# Patient Record
Sex: Female | Born: 1985 | Race: Black or African American | Hispanic: No | Marital: Married | Smoking: Former smoker
Health system: Southern US, Community
[De-identification: ages and names within clinical notes are randomized; demographics above are authoritative.]

## PROBLEM LIST (undated history)

## (undated) ENCOUNTER — Inpatient Hospital Stay (HOSPITAL_COMMUNITY): Payer: Self-pay

## (undated) DIAGNOSIS — Z789 Other specified health status: Secondary | ICD-10-CM

## (undated) HISTORY — PX: TONGUE SURGERY: SHX810

---

## 2011-07-13 NOTE — L&D Delivery Note (Signed)
Delivery Note At 7:57 AM a viable female was delivered via  (Presentation:  ROA ;  ).  APGAR: 9 - 9 , ; weight .   Placenta status:  Intact , .  Cord: 3 vessel with the following complications: none.  Cord pH: none  Anesthesia: Epidural  Episiotomy: none Lacerations: none Suture Repair: none Est. Blood Loss (mL):  Mom to postpartum.  Baby to nursery-stable.  [name],[name] 07/02/2012, 8:12 AM

## 2012-06-24 ENCOUNTER — Encounter: Payer: Self-pay | Admitting: *Deleted

## 2012-07-02 ENCOUNTER — Encounter (HOSPITAL_COMMUNITY): Payer: Self-pay | Admitting: *Deleted

## 2012-07-02 ENCOUNTER — Inpatient Hospital Stay (HOSPITAL_COMMUNITY): Payer: Medicaid Other | Admitting: Anesthesiology

## 2012-07-02 ENCOUNTER — Inpatient Hospital Stay (HOSPITAL_COMMUNITY)
Admission: AD | Admit: 2012-07-02 | Discharge: 2012-07-03 | DRG: 775 | Disposition: A | Discharge status: Home / Self Care | Payer: Medicaid Other | Source: Ambulatory Visit | Attending: Obstetrics | Admitting: Obstetrics

## 2012-07-02 ENCOUNTER — Encounter (HOSPITAL_COMMUNITY): Payer: Self-pay | Admitting: Anesthesiology

## 2012-07-02 LAB — CBC
MCV: 90.7 fL (ref 78.0–100.0)
Platelets: 241 10*3/uL (ref 150–400)
RBC: 3.78 MIL/uL — ABNORMAL LOW (ref 3.87–5.11)
RDW: 15.6 % — ABNORMAL HIGH (ref 11.5–15.5)
WBC: 12 10*3/uL — ABNORMAL HIGH (ref 4.0–10.5)

## 2012-07-02 LAB — OB RESULTS CONSOLE HIV ANTIBODY (ROUTINE TESTING): HIV: NONREACTIVE

## 2012-07-02 LAB — OB RESULTS CONSOLE ABO/RH

## 2012-07-02 LAB — OB RESULTS CONSOLE ANTIBODY SCREEN: Antibody Screen: NEGATIVE

## 2012-07-02 LAB — OB RESULTS CONSOLE RUBELLA ANTIBODY, IGM: Rubella: IMMUNE

## 2012-07-02 LAB — OB RESULTS CONSOLE HEPATITIS B SURFACE ANTIGEN: Hepatitis B Surface Ag: NEGATIVE

## 2012-07-02 MED ORDER — DIPHENHYDRAMINE HCL 50 MG/ML IJ SOLN: 12.5000 mg | INTRAMUSCULAR | Status: DC | PRN

## 2012-07-02 MED ORDER — ZOLPIDEM TARTRATE 5 MG PO TABS: 5.0000 mg | ORAL_TABLET | Freq: Every evening | ORAL | Status: DC | PRN

## 2012-07-02 MED ORDER — FENTANYL 2.5 MCG/ML BUPIVACAINE 1/10 % EPIDURAL INFUSION (WH - ANES)
14.0000 mL/h | INTRAMUSCULAR | Status: DC
  Administered 2012-07-02: 14 mL/h via EPIDURAL
  Filled 2012-07-02 (×2): qty 125

## 2012-07-02 MED ORDER — OXYCODONE-ACETAMINOPHEN 5-325 MG PO TABS: 1.0000 | ORAL_TABLET | ORAL | Status: DC | PRN

## 2012-07-02 MED ORDER — FLEET ENEMA 7-19 GM/118ML RE ENEM: 1.0000 | ENEMA | Freq: Once | RECTAL | Status: DC

## 2012-07-02 MED ORDER — LACTATED RINGERS IV SOLN: 500.0000 mL | INTRAVENOUS | Status: DC | PRN

## 2012-07-02 MED ORDER — CITRIC ACID-SODIUM CITRATE 334-500 MG/5ML PO SOLN: 30.0000 mL | ORAL | Status: DC | PRN

## 2012-07-02 MED ORDER — WITCH HAZEL-GLYCERIN EX PADS: 1.0000 "application " | MEDICATED_PAD | CUTANEOUS | Status: DC | PRN

## 2012-07-02 MED ORDER — OXYTOCIN BOLUS FROM INFUSION: 500.0000 mL | INTRAVENOUS | Status: DC

## 2012-07-02 MED ORDER — PRENATAL MULTIVITAMIN CH
1.0000 | ORAL_TABLET | Freq: Every day | ORAL | Status: DC
  Administered 2012-07-02 – 2012-07-03 (×2): 1 via ORAL
  Filled 2012-07-02 (×2): qty 1

## 2012-07-02 MED ORDER — EPHEDRINE 5 MG/ML INJ
10.0000 mg | INTRAVENOUS | Status: DC | PRN
  Filled 2012-07-02: qty 2

## 2012-07-02 MED ORDER — ACETAMINOPHEN 325 MG PO TABS: 650.0000 mg | ORAL_TABLET | ORAL | Status: DC | PRN

## 2012-07-02 MED ORDER — LANOLIN HYDROUS EX OINT: TOPICAL_OINTMENT | CUTANEOUS | Status: DC | PRN

## 2012-07-02 MED ORDER — LACTATED RINGERS IV SOLN: INTRAVENOUS | Status: DC

## 2012-07-02 MED ORDER — ONDANSETRON HCL 4 MG PO TABS: 4.0000 mg | ORAL_TABLET | ORAL | Status: DC | PRN

## 2012-07-02 MED ORDER — EPHEDRINE 5 MG/ML INJ
10.0000 mg | INTRAVENOUS | Status: DC | PRN
  Filled 2012-07-02: qty 4
  Filled 2012-07-02: qty 2
  Filled 2012-07-02: qty 4

## 2012-07-02 MED ORDER — BENZOCAINE-MENTHOL 20-0.5 % EX AERO: 1.0000 "application " | INHALATION_SPRAY | CUTANEOUS | Status: DC | PRN

## 2012-07-02 MED ORDER — OXYTOCIN 40 UNITS IN LACTATED RINGERS INFUSION - SIMPLE MED
62.5000 mL/h | INTRAVENOUS | Status: DC
  Administered 2012-07-02: 500 mL/h via INTRAVENOUS
  Filled 2012-07-02: qty 1000

## 2012-07-02 MED ORDER — DIPHENHYDRAMINE HCL 25 MG PO CAPS: 25.0000 mg | ORAL_CAPSULE | Freq: Four times a day (QID) | ORAL | Status: DC | PRN

## 2012-07-02 MED ORDER — LIDOCAINE HCL (PF) 1 % IJ SOLN
30.0000 mL | INTRAMUSCULAR | Status: DC | PRN
  Filled 2012-07-02: qty 30

## 2012-07-02 MED ORDER — SIMETHICONE 80 MG PO CHEW: 80.0000 mg | CHEWABLE_TABLET | ORAL | Status: DC | PRN

## 2012-07-02 MED ORDER — TETANUS-DIPHTH-ACELL PERTUSSIS 5-2.5-18.5 LF-MCG/0.5 IM SUSP: 0.5000 mL | Freq: Once | INTRAMUSCULAR | Status: DC

## 2012-07-02 MED ORDER — IBUPROFEN 600 MG PO TABS
600.0000 mg | ORAL_TABLET | Freq: Four times a day (QID) | ORAL | Status: DC
  Administered 2012-07-02 – 2012-07-03 (×4): 600 mg via ORAL
  Filled 2012-07-02 (×4): qty 1

## 2012-07-02 MED ORDER — OXYTOCIN 40 UNITS IN LACTATED RINGERS INFUSION - SIMPLE MED: 62.5000 mL/h | INTRAVENOUS | Status: DC | PRN

## 2012-07-02 MED ORDER — SENNOSIDES-DOCUSATE SODIUM 8.6-50 MG PO TABS: 2.0000 | ORAL_TABLET | Freq: Every day | ORAL | Status: DC

## 2012-07-02 MED ORDER — ACETAMINOPHEN 325 MG PO TABS
650.0000 mg | ORAL_TABLET | ORAL | Status: DC | PRN
  Administered 2012-07-02: 650 mg via ORAL
  Filled 2012-07-02: qty 2

## 2012-07-02 MED ORDER — DIBUCAINE 1 % RE OINT: 1.0000 "application " | TOPICAL_OINTMENT | RECTAL | Status: DC | PRN

## 2012-07-02 MED ORDER — ONDANSETRON HCL 4 MG/2ML IJ SOLN: 4.0000 mg | Freq: Four times a day (QID) | INTRAMUSCULAR | Status: DC | PRN

## 2012-07-02 MED ORDER — LACTATED RINGERS IV SOLN
500.0000 mL | Freq: Once | INTRAVENOUS | Status: AC
  Administered 2012-07-02: 500 mL via INTRAVENOUS

## 2012-07-02 MED ORDER — PANTOPRAZOLE SODIUM 40 MG PO TBEC
40.0000 mg | DELAYED_RELEASE_TABLET | Freq: Two times a day (BID) | ORAL | Status: DC
  Administered 2012-07-02 – 2012-07-03 (×2): 40 mg via ORAL
  Filled 2012-07-02 (×2): qty 1

## 2012-07-02 MED ORDER — IBUPROFEN 600 MG PO TABS: 600.0000 mg | ORAL_TABLET | Freq: Four times a day (QID) | ORAL | Status: DC | PRN

## 2012-07-02 MED ORDER — PHENYLEPHRINE 40 MCG/ML (10ML) SYRINGE FOR IV PUSH (FOR BLOOD PRESSURE SUPPORT)
80.0000 ug | PREFILLED_SYRINGE | INTRAVENOUS | Status: DC | PRN
  Filled 2012-07-02 (×2): qty 5
  Filled 2012-07-02: qty 2

## 2012-07-02 MED ORDER — PHENYLEPHRINE 40 MCG/ML (10ML) SYRINGE FOR IV PUSH (FOR BLOOD PRESSURE SUPPORT)
80.0000 ug | PREFILLED_SYRINGE | INTRAVENOUS | Status: DC | PRN
  Filled 2012-07-02: qty 2

## 2012-07-02 MED ORDER — ONDANSETRON HCL 4 MG/2ML IJ SOLN: 4.0000 mg | INTRAMUSCULAR | Status: DC | PRN

## 2012-07-02 MED ORDER — LIDOCAINE HCL (PF) 1 % IJ SOLN
INTRAMUSCULAR | Status: DC | PRN
  Administered 2012-07-02 (×4): 4 mL

## 2012-07-02 NOTE — Anesthesia Preprocedure Evaluation (Signed)

## 2012-07-02 NOTE — Progress Notes (Signed)
RN to the bedside to assist with pericare.  Pt. To BR for void, med. Amount of red tinge urine, pericare given, clean pad with ice pack put in place.  Pt. Tolerated void. Steady used; pt. Transferred to postpartum room #108 via WC.

## 2012-07-02 NOTE — Progress Notes (Signed)
Korayma Little is a 26 y.o. G3P1011 at [redacted]w[redacted]d by LMP admitted for active labor  Subjective:   Objective: BP 122/72  Pulse 91  Temp 97.6 F (36.4 C) (Oral)  Resp 18  Ht 5\' 4"  (1.626 m)  Wt 160 lb (72.576 kg)  BMI 27.46 kg/m2  LMP 10/01/2011      FHT:  FHR: 150 bpm, variability: moderate,  accelerations:  Present,  decelerations:  Absent UC:   regular, every 3 minutes SVE:   Dilation: 6 Effacement (%): 90 Station: -1 Exam by:: a. harper, rnc-ob  Labs: Lab Results  Component Value Date   WBC 12.0* 07/02/2012   HGB 11.0* 07/02/2012   HCT 34.3* 07/02/2012   MCV 90.7 07/02/2012   PLT 241 07/02/2012    Assessment / Plan: Spontaneous labor, progressing normally  Labor: Progressing normally Preeclampsia:  n/a Fetal Wellbeing:  Category I Pain Control:  Epidural I/D:  n/a Anticipated MOD:  NSVD  [name],[name] 07/02/2012, 5:07 AM

## 2012-07-02 NOTE — Anesthesia Postprocedure Evaluation (Signed)
  Anesthesia Post-op Note  Patient: Emma Mcdonald  Procedure(s) Performed: * No procedures listed *  Patient Location: Mother/Baby  Anesthesia Type:Epidural  Level of Consciousness: awake, alert  and oriented  Airway and Oxygen Therapy: Patient Spontanous Breathing  Post-op Pain: mild  Post-op Assessment: Patient's Cardiovascular Status Stable, Respiratory Function Stable, Patent Airway, No signs of Nausea or vomiting and Pain level controlled  Post-op Vital Signs: stable  Complications: No apparent anesthesia complications

## 2012-07-02 NOTE — Anesthesia Procedure Notes (Addendum)
Epidural Patient location during procedure: OB Start time: 07/02/2012 5:37 AM  Staffing Performed by: anesthesiologist   Preanesthetic Checklist Completed: patient identified, site marked, surgical consent, pre-op evaluation, timeout performed, IV checked, risks and benefits discussed and monitors and equipment checked  Epidural Patient position: sitting Prep: site prepped and draped and DuraPrep Patient monitoring: continuous pulse ox and blood pressure Approach: midline Injection technique: LOR air  Needle:  Needle type: Tuohy  Needle gauge: 17 G Needle length: 9 cm and 9 Needle insertion depth: 4.5 cm Catheter type: closed end flexible Catheter size: 19 Gauge Catheter at skin depth: 9.5 cm Test dose: negative  Assessment Events: blood not aspirated, injection not painful, no injection resistance, negative IV test and no paresthesia  Additional Notes Discussed risk of headache, infection, bleeding, nerve injury and failed or incomplete block.  Patient voices understanding and wishes to proceed.  Epidural placed easily on first attempt.  Patient tolerated procedure well with no apparent complications.  Jasmine December, MDReason for block:procedure for pain

## 2012-07-02 NOTE — Progress Notes (Signed)
Kacie Little is a 26 y.o. G3P1011 at [redacted]w[redacted]d by LMP admitted for active labor  Subjective:   Objective: BP 114/63  Pulse 88  Temp 97.6 F (36.4 C) (Oral)  Resp 18  Ht 5\' 4"  (1.626 m)  Wt 160 lb (72.576 kg)  BMI 27.46 kg/m2  SpO2 99%  LMP 10/01/2011      FHT:  FHR: 150 bpm, variability: moderate,  accelerations:  Present,  decelerations:  Absent UC:   regular, every 3 minutes SVE:   8cm /100% /-1/ Vertex  Labs: Lab Results  Component Value Date   WBC 12.0* 07/02/2012   HGB 11.0* 07/02/2012   HCT 34.3* 07/02/2012   MCV 90.7 07/02/2012   PLT 241 07/02/2012    Assessment / Plan: Spontaneous labor, progressing normally  Labor: Progressing normally Preeclampsia:  n/a Fetal Wellbeing:  Category I Pain Control:  Epidural I/D:  n/a Anticipated MOD:  NSVD  Sala Tague A 07/02/2012, 6:30 AM

## 2012-07-02 NOTE — H&P (Signed)
Emma Mcdonald is a 26 y.o. female presenting for UC's. Maternal Medical History:  Reason for admission: Reason for admission: contractions.  26 yo G3 P1.   EDC 07-04-12.  Presents with UC's.  Contractions: Onset was 3-5 hours ago.   Frequency: regular.   Perceived severity is moderate.    Fetal activity: Perceived fetal activity is normal.   Last perceived fetal movement was within the past hour.    Prenatal complications: no prenatal complications Prenatal Complications - Diabetes: none.    OB History    Grav Para Term Preterm Abortions TAB SAB Ect Mult Living   3 1 1  1 1    1      History reviewed. No pertinent past medical history. History reviewed. No pertinent past surgical history. Family History: family history is negative for Other. Social History:  reports that she has never smoked. She has never used smokeless tobacco. She reports that she does not drink alcohol or use illicit drugs.   Prenatal Transfer Tool  Maternal Diabetes: No Genetic Screening: Normal Maternal Ultrasounds/Referrals: Normal Fetal Ultrasounds or other Referrals:  None Maternal Substance Abuse:  No Significant Maternal Medications:  Meds include: Other:   PNV Significant Maternal Lab Results:  Lab values include: Group B Strep negative Other Comments:  None  Review of Systems  All other systems reviewed and are negative.    Dilation: 6 Effacement (%): 90 Station: -1 Exam by:: a. harper, rnc-ob Blood pressure 122/72, pulse 91, temperature 97.6 F (36.4 C), temperature source Oral, resp. rate 18, height 5\' 4"  (1.626 m), weight 160 lb (72.576 kg), last menstrual period 10/01/2011. Maternal Exam:  Uterine Assessment: Contraction strength is firm.  Abdomen: Patient reports no abdominal tenderness. Fetal presentation: vertex  Introitus: Normal vulva. Normal vagina.  Pelvis: adequate for delivery.   Cervix: Cervix evaluated by digital exam.     Physical Exam  Nursing note and vitals  reviewed. Constitutional: She is oriented to person, place, and time. She appears well-developed and well-nourished.  HENT:  Head: Normocephalic and atraumatic.  Eyes: Conjunctivae normal are normal. Pupils are equal, round, and reactive to light.  Neck: Normal range of motion. Neck supple.  Cardiovascular: Normal rate and regular rhythm.   Respiratory: Effort normal and breath sounds normal.  GI: Soft.  Genitourinary: Vagina normal and uterus normal.  Musculoskeletal: Normal range of motion.  Neurological: She is alert and oriented to person, place, and time.  Skin: Skin is warm and dry.  Psychiatric: She has a normal mood and affect. Her behavior is normal. Judgment and thought content normal.    Prenatal labs: ABO, Rh: O/Positive/-- (12/22 0000) Antibody: Negative (12/22 0000) Rubella: Immune (12/22 0000) RPR: Nonreactive (12/22 0000)  HBsAg: Negative (12/22 0000)  HIV: Non-reactive (12/22 0000)  GBS: Negative (12/22 0000)   Assessment/Plan: 39.2 weeks.  Active labor.  Expectant management.   [name],[name] 07/02/2012, 4:56 AM

## 2012-07-02 NOTE — Progress Notes (Signed)
NSVD at 69, female, Dr. Clearance Coots at the bedside.

## 2012-07-02 NOTE — MAU Note (Signed)
Contra tions since 0200 not q 2-3 minutes

## 2012-07-03 LAB — CBC
HCT: 32.5 % — ABNORMAL LOW (ref 36.0–46.0)
Hemoglobin: 10.5 g/dL — ABNORMAL LOW (ref 12.0–15.0)
MCV: 91.8 fL (ref 78.0–100.0)
WBC: 15.3 10*3/uL — ABNORMAL HIGH (ref 4.0–10.5)

## 2012-07-03 MED ORDER — MEDROXYPROGESTERONE ACETATE 150 MG/ML IM SUSP: 150.0000 mg | INTRAMUSCULAR | Status: DC

## 2012-07-03 NOTE — Discharge Summary (Signed)
  Obstetric Discharge Summary Reason for Admission: onset of labor Prenatal Procedures: none Intrapartum Procedures: spontaneous vaginal delivery Postpartum Procedures: none Complications-Operative and Postpartum: none  Hemoglobin  Date Value Range Status  07/03/2012 10.5* 12.0 - 15.0 g/dL Final     HCT  Date Value Range Status  07/03/2012 32.5* 36.0 - 46.0 % Final    Physical Exam:  General: alert Lochia: appropriate Uterine: firm Incision: n/Mcdonald DVT Evaluation: No evidence of DVT seen on physical exam.  Discharge Diagnoses: Active Problems:  Normal delivery   Discharge Information: Date: 07/03/2012 Activity: pelvic rest Diet: routine Medications:  Prior to Admission medications   Medication Sig Start Date End Date Taking? Authorizing Provider  Prenatal Vit-Fe Fumarate-FA (PRENATAL MULTIVITAMIN) TABS Take 1 tablet by mouth daily.   Yes Historical Provider, MD  medroxyPROGESTERone (DEPO-PROVERA) 150 MG/ML injection Inject 1 mL (150 mg total) into the muscle every 3 (three) months. 07/03/12   [name], MD    Condition: stable Instructions: refer to routine discharge instructions Discharge to: home Follow-up Information    Follow up with [name],[name], MD. Schedule an appointment as soon as possible for Mcdonald visit in 6 weeks.   Contact information:   [address] [place] [address] [phone]          Newborn Data: Live born  Information for the patient's newborn:  [name], [name] [[id number]]  female ; APGAR (1 MIN): 9   APGAR (5 MINS): 9     Home with mother.  Emma Mcdonald,Emma Mcdonald Emma Mcdonald 07/03/2012, 10:46 AM

## 2012-07-03 NOTE — Progress Notes (Signed)
Ur chart review completed.  

## 2012-11-27 ENCOUNTER — Ambulatory Visit (INDEPENDENT_AMBULATORY_CARE_PROVIDER_SITE_OTHER): Payer: BC Managed Care – PPO | Admitting: *Deleted

## 2012-11-27 VITALS — BP 111/73 | HR 74 | Temp 98.4°F | Wt 137.2 lb

## 2012-11-27 DIAGNOSIS — Z3202 Encounter for pregnancy test, result negative: Secondary | ICD-10-CM

## 2012-11-27 NOTE — Progress Notes (Signed)
Patient is here today for her 2 week start up for her Depo injection.  Patient was to return to office May 11.  First UPT negative.  Patient advised to return to office in two weeks and no intercourse until she comes back.

## 2012-12-11 ENCOUNTER — Other Ambulatory Visit: Payer: BC Managed Care – PPO | Admitting: *Deleted

## 2012-12-11 NOTE — Progress Notes (Signed)
Pt reports last sexually active May 22 which was after initial UPT test done in office for Depo start. Pt reports does not have regular cycles. I spoke with doctor Clearance Coots and he advised to have pt abstain and return 3 to 4 days to repeat UPT with abstinence and will start Depo at that time. Pt expressed understanding.

## 2012-12-14 ENCOUNTER — Ambulatory Visit (INDEPENDENT_AMBULATORY_CARE_PROVIDER_SITE_OTHER): Payer: BC Managed Care – PPO | Admitting: *Deleted

## 2012-12-14 VITALS — BP 107/72 | HR 75 | Temp 97.3°F | Wt 136.0 lb

## 2012-12-14 DIAGNOSIS — Z3049 Encounter for surveillance of other contraceptives: Secondary | ICD-10-CM

## 2012-12-14 DIAGNOSIS — Z3202 Encounter for pregnancy test, result negative: Secondary | ICD-10-CM

## 2012-12-14 MED ORDER — MEDROXYPROGESTERONE ACETATE 150 MG/ML IM SUSP
150.0000 mg | INTRAMUSCULAR | Status: DC
  Administered 2012-12-14 – 2013-03-13 (×2): 150 mg via INTRAMUSCULAR

## 2012-12-14 NOTE — Progress Notes (Signed)
Pt here for 2nd UPT. Pt confirms abstinence x 2 weeks. Negative UPT, depo injection given. Pt tolerated well. RTO 03/07/2013 pt expressed understanding.

## 2012-12-14 NOTE — Patient Instructions (Addendum)
Please return to office as schedule and as needed. Please call pharmacy for refills and pick-up prescription before next appointment.  

## 2013-03-07 ENCOUNTER — Ambulatory Visit: Payer: BC Managed Care – PPO

## 2013-03-13 ENCOUNTER — Ambulatory Visit (INDEPENDENT_AMBULATORY_CARE_PROVIDER_SITE_OTHER): Payer: BC Managed Care – PPO | Admitting: *Deleted

## 2013-03-13 VITALS — BP 103/68 | HR 91 | Temp 98.3°F | Wt 135.0 lb

## 2013-03-13 DIAGNOSIS — IMO0001 Reserved for inherently not codable concepts without codable children: Secondary | ICD-10-CM

## 2013-03-13 DIAGNOSIS — Z3049 Encounter for surveillance of other contraceptives: Secondary | ICD-10-CM

## 2013-03-13 NOTE — Progress Notes (Signed)
Patient is here today for her Depo injection.  Patient tolerated well. RTO for next injection 06/04/13.

## 2013-06-04 ENCOUNTER — Ambulatory Visit (INDEPENDENT_AMBULATORY_CARE_PROVIDER_SITE_OTHER): Payer: BC Managed Care – PPO | Admitting: *Deleted

## 2013-06-04 VITALS — BP 102/71 | HR 90 | Temp 98.8°F | Ht 64.0 in | Wt 136.0 lb

## 2013-06-04 DIAGNOSIS — Z309 Encounter for contraceptive management, unspecified: Secondary | ICD-10-CM

## 2013-06-04 NOTE — Progress Notes (Signed)
Subjective:     Emma Mcdonald is a 27 y.o. female here for a routine exam.  Current complaints: Patient is in the office today for her depo Provera. P{atient states she has bothersome spotting- dark discharge..  Personal health questionnaire reviewed: not asked.   Gynecologic History Patient's last menstrual period was 05/10/2013. Contraception: Depo-Provera injections Last Pap: Due with next injection  Obstetric History OB History  Gravida Para Term Preterm AB SAB TAB Ectopic Multiple Living  3 2 2  1  1   2     # Outcome Date GA Lbr Len/2nd Weight Sex Delivery Anes PTL Lv  3 TRM 07/02/12 [redacted]w[redacted]d 05:37 / 00:20 5 lb 12.4 oz (2.62 kg) F SVD EPI  Y  2 TAB 03/12/10          1 TRM 02/13/09 [redacted]w[redacted]d  5 lb 4 oz (2.381 kg) F SVD EPI N Y       The following portions of the patient's history were reviewed and updated as appropriate: allergies, current medications, past family history, past medical history, past social history, past surgical history and problem list.  Review of Systems not done    Objective:    No exam performed today, injection only.    Assessment:    Healthy female exam.    Plan:    Contraception: Depo-Provera injections.

## 2013-08-27 ENCOUNTER — Ambulatory Visit: Payer: BC Managed Care – PPO

## 2013-08-27 ENCOUNTER — Ambulatory Visit: Payer: BC Managed Care – PPO | Admitting: Obstetrics

## 2013-09-17 ENCOUNTER — Ambulatory Visit: Payer: BC Managed Care – PPO | Admitting: Obstetrics

## 2015-02-27 ENCOUNTER — Other Ambulatory Visit (INDEPENDENT_AMBULATORY_CARE_PROVIDER_SITE_OTHER): Payer: Medicaid Other

## 2015-02-27 ENCOUNTER — Encounter: Payer: Self-pay | Admitting: Obstetrics

## 2015-02-27 VITALS — BP 103/72 | HR 85 | Temp 98.4°F | Ht 64.0 in | Wt 143.3 lb

## 2015-02-27 DIAGNOSIS — Z3200 Encounter for pregnancy test, result unknown: Secondary | ICD-10-CM

## 2015-02-27 LAB — POCT URINE PREGNANCY: Preg Test, Ur: POSITIVE — AB

## 2015-04-10 ENCOUNTER — Encounter: Payer: Self-pay | Admitting: Obstetrics

## 2015-04-10 ENCOUNTER — Ambulatory Visit (INDEPENDENT_AMBULATORY_CARE_PROVIDER_SITE_OTHER): Payer: Medicaid Other | Admitting: Obstetrics

## 2015-04-10 VITALS — BP 97/69 | HR 91 | Wt 143.0 lb

## 2015-04-10 DIAGNOSIS — Z3481 Encounter for supervision of other normal pregnancy, first trimester: Secondary | ICD-10-CM | POA: Diagnosis not present

## 2015-04-10 DIAGNOSIS — Z1389 Encounter for screening for other disorder: Secondary | ICD-10-CM

## 2015-04-10 DIAGNOSIS — Z331 Pregnant state, incidental: Secondary | ICD-10-CM

## 2015-04-10 DIAGNOSIS — Z3687 Encounter for antenatal screening for uncertain dates: Secondary | ICD-10-CM

## 2015-04-10 LAB — POCT URINALYSIS DIPSTICK
BILIRUBIN UA: NEGATIVE
GLUCOSE UA: NEGATIVE
Ketones, UA: NEGATIVE
Nitrite, UA: NEGATIVE
SPEC GRAV UA: 1.02
UROBILINOGEN UA: NEGATIVE
pH, UA: 5

## 2015-04-10 MED ORDER — COMPLETE NATAL DHA 29-1-200 & 250 MG PO MISC: 1.0000 | Freq: Every day | ORAL | Status: DC

## 2015-04-10 NOTE — Progress Notes (Signed)
Subjective:    Emma Mcdonald is being seen today for her first obstetrical visit.  This is a planned pregnancy. She is at [redacted]w[redacted]d gestation. Her obstetrical history is significant for none. Relationship with FOB: spouse, living together. Patient does intend to breast feed. Pregnancy history fully reviewed.  The information documented in the HPI was reviewed and verified.  Menstrual History: OB History    Gravida Para Term Preterm AB TAB SAB Ectopic Multiple Living   Patient's last menstrual period was 01/27/2015.    History reviewed. No pertinent past medical history.  History reviewed. No pertinent past surgical history.   (Not in a hospital admission) No Known Allergies  Social History  Substance Use Topics  . Smoking status: Never Smoker   . Smokeless tobacco: Never Used  . Alcohol Use: 0.0 oz/week    0 Standard drinks or equivalent per week    Family History  Problem Relation Age of Onset  . Other Neg Hx   . Cancer Mother      Review of Systems Constitutional: negative for weight loss Gastrointestinal: negative for vomiting Genitourinary:negative for genital lesions and vaginal discharge and dysuria Musculoskeletal:negative for back pain Behavioral/Psych: negative for abusive relationship, depression, illegal drug usage and tobacco use    Objective:    BP 97/69 mmHg  Pulse 91  Wt 143 lb (64.864 kg)  LMP 01/27/2015 General Appearance:    Alert, cooperative, no distress, appears stated age  Head:    Normocephalic, without obvious abnormality, atraumatic  Eyes:    PERRL, conjunctiva/corneas clear, EOM's intact, fundi    benign, both eyes  Ears:    Normal TM's and external ear canals, both ears  Nose:   Nares normal, septum midline, mucosa normal, no drainage    or sinus tenderness  Throat:   Lips, mucosa, and tongue normal; teeth and gums normal  Neck:   Supple, symmetrical, trachea midline, no adenopathy;    thyroid:  no  enlargement/tenderness/nodules; no carotid   bruit or JVD  Back:     Symmetric, no curvature, ROM normal, no CVA tenderness  Lungs:     Clear to auscultation bilaterally, respirations unlabored  Chest Wall:    No tenderness or deformity   Heart:    Regular rate and rhythm, S1 and S2 normal, no murmur, rub   or gallop  Breast Exam:    No tenderness, masses, or nipple abnormality  Abdomen:     Soft, non-tender, bowel sounds active all four quadrants,    no masses, no organomegaly  Genitalia:    Normal female without lesion, discharge or tenderness  Extremities:   Extremities normal, atraumatic, no cyanosis or edema  Pulses:   2+ and symmetric all extremities  Skin:   Skin color, texture, turgor normal, no rashes or lesions  Lymph nodes:   Cervical, supraclavicular, and axillary nodes normal  Neurologic:   CNII-XII intact, normal strength, sensation and reflexes    throughout      Lab Review Urine pregnancy test Labs reviewed yes Radiologic studies reviewed no  Assessment:    Pregnancy at [redacted]w[redacted]d weeks    Unsure LMP   Plan:   Ultrasound ordered for dating.    Prenatal vitamins.  Counseling provided regarding continued use of seat belts, cessation of alcohol consumption, smoking or use of illicit drugs; infection precautions i.e., influenza/TDAP immunizations, toxoplasmosis,CMV, parvovirus, listeria and varicella; workplace safety, exercise during pregnancy; routine  dental care, safe medications, sexual activity, hot tubs, saunas, pools, travel, caffeine use, fish and methlymercury, potential toxins, hair treatments, varicose veins Weight gain recommendations per IOM guidelines reviewed: underweight/BMI< 18.5--> gain 28 - 40 lbs; normal weight/BMI 18.5 - 24.9--> gain 25 - 35 lbs; overweight/BMI 25 - 29.9--> gain 15 - 25 lbs; obese/BMI >30->gain  11 - 20 lbs Problem list reviewed and updated. FIRST/CF mutation testing/NIPT/QUAD SCREEN/fragile X/Ashkenazi Jewish population testing/Spinal  muscular atrophy discussed: requested. Role of ultrasound in pregnancy discussed; fetal survey: requested. Amniocentesis discussed: not indicated. VBAC calculator score: VBAC consent form provided Meds ordered this encounter  Medications  . Prenat-FeBis-FePro-FA-CA-Omega (COMPLETE NATAL DHA) 29-1-200 & 250 MG MISC    Sig: Take 1 tablet by mouth daily before breakfast.    Dispense:  90 each    Refill:  3   Orders Placed This Encounter  Procedures  . Culture, OB Urine  . SureSwab, Vaginosis/Vaginitis Plus  . US OB Comp Less 14 Wks    Standing Status: Future     Number of Occurrences:      Standing Expiration Date: 06/09/2016    Order Specific Question:  Reason for Exam (SYMPTOM  OR DIAGNOSIS REQUIRED)    Answer:  unsure LMP    Order Specific Question:  Preferred imaging location?    Answer:  [facility]  . Obstetric panel  . HIV antibody  . Hemoglobinopathy evaluation  . Varicella zoster antibody, IgG  . Vit D  25 hydroxy (rtn osteoporosis monitoring)  . TSH  . POCT urinalysis dipstick    Follow up in 4 weeks.

## 2015-04-11 LAB — OBSTETRIC PANEL
Antibody Screen: NEGATIVE
BASOS PCT: 1 % (ref 0–1)
Basophils Absolute: 0.1 10*3/uL (ref 0.0–0.1)
EOS ABS: 0.1 10*3/uL (ref 0.0–0.7)
Eosinophils Relative: 1 % (ref 0–5)
HEMATOCRIT: 41.6 % (ref 36.0–46.0)
HEMOGLOBIN: 14.2 g/dL (ref 12.0–15.0)
HEP B S AG: NEGATIVE
Lymphocytes Relative: 20 % (ref 12–46)
Lymphs Abs: 1.7 10*3/uL (ref 0.7–4.0)
MCH: 32.2 pg (ref 26.0–34.0)
MCHC: 34.1 g/dL (ref 30.0–36.0)
MCV: 94.3 fL (ref 78.0–100.0)
MPV: 9.9 fL (ref 8.6–12.4)
Monocytes Absolute: 0.5 10*3/uL (ref 0.1–1.0)
Monocytes Relative: 6 % (ref 3–12)
NEUTROS ABS: 6.2 10*3/uL (ref 1.7–7.7)
NEUTROS PCT: 72 % (ref 43–77)
Platelets: 306 10*3/uL (ref 150–400)
RBC: 4.41 MIL/uL (ref 3.87–5.11)
RDW: 13.3 % (ref 11.5–15.5)
Rh Type: POSITIVE
Rubella: 0.85 Index (ref <0.90)
WBC: 8.6 10*3/uL (ref 4.0–10.5)

## 2015-04-11 LAB — CULTURE, OB URINE
Colony Count: NO GROWTH
ORGANISM ID, BACTERIA: NO GROWTH

## 2015-04-11 LAB — VITAMIN D 25 HYDROXY (VIT D DEFICIENCY, FRACTURES): Vit D, 25-Hydroxy: 25 ng/mL — ABNORMAL LOW (ref 30–100)

## 2015-04-11 LAB — HIV ANTIBODY (ROUTINE TESTING W REFLEX): HIV 1&2 Ab, 4th Generation: NONREACTIVE

## 2015-04-11 LAB — VARICELLA ZOSTER ANTIBODY, IGG: VARICELLA IGG: 147.5 {index} — AB (ref <135.00)

## 2015-04-11 LAB — TSH: TSH: 1.77 u[IU]/mL (ref 0.350–4.500)

## 2015-04-14 LAB — SURESWAB, VAGINOSIS/VAGINITIS PLUS
Atopobium vaginae: NOT DETECTED Log (cells/mL)
C. ALBICANS, DNA: NOT DETECTED
C. PARAPSILOSIS, DNA: NOT DETECTED
C. glabrata, DNA: NOT DETECTED
C. trachomatis RNA, TMA: NOT DETECTED
C. tropicalis, DNA: NOT DETECTED
Gardnerella vaginalis: NOT DETECTED Log (cells/mL)
LACTOBACILLUS SPECIES: 7.9 Log (cells/mL)
MEGASPHAERA SPECIES: NOT DETECTED Log (cells/mL)
N. GONORRHOEAE RNA, TMA: NOT DETECTED
T. VAGINALIS RNA, QL TMA: NOT DETECTED

## 2015-04-14 LAB — PAP IG W/ RFLX HPV ASCU

## 2015-04-14 LAB — HEMOGLOBINOPATHY EVALUATION
HEMOGLOBIN OTHER: 0 %
HGB A2 QUANT: 2.5 % (ref 2.2–3.2)
HGB A: 97.5 % (ref 96.8–97.8)
HGB S QUANTITAION: 0 %
Hgb F Quant: 0 % (ref 0.0–2.0)

## 2015-04-16 ENCOUNTER — Ambulatory Visit (HOSPITAL_COMMUNITY)
Admission: RE | Admit: 2015-04-16 | Discharge: 2015-04-16 | Disposition: A | Discharge status: Home / Self Care | Payer: 59 | Source: Ambulatory Visit | Attending: Obstetrics | Admitting: Obstetrics

## 2015-04-16 DIAGNOSIS — Z36 Encounter for antenatal screening of mother: Secondary | ICD-10-CM | POA: Diagnosis not present

## 2015-04-16 DIAGNOSIS — Z3A11 11 weeks gestation of pregnancy: Secondary | ICD-10-CM | POA: Diagnosis not present

## 2015-04-16 DIAGNOSIS — Z3687 Encounter for antenatal screening for uncertain dates: Secondary | ICD-10-CM

## 2015-05-07 ENCOUNTER — Ambulatory Visit (INDEPENDENT_AMBULATORY_CARE_PROVIDER_SITE_OTHER): Payer: 59 | Admitting: Certified Nurse Midwife

## 2015-05-07 VITALS — BP 99/64 | HR 96 | Temp 98.8°F | Wt 144.0 lb

## 2015-05-07 DIAGNOSIS — Z331 Pregnant state, incidental: Secondary | ICD-10-CM | POA: Diagnosis not present

## 2015-05-07 DIAGNOSIS — Z3482 Encounter for supervision of other normal pregnancy, second trimester: Secondary | ICD-10-CM | POA: Diagnosis not present

## 2015-05-07 DIAGNOSIS — Z1389 Encounter for screening for other disorder: Secondary | ICD-10-CM | POA: Diagnosis not present

## 2015-05-07 LAB — POCT URINALYSIS DIPSTICK
Bilirubin, UA: NEGATIVE
Glucose, UA: NEGATIVE
Ketones, UA: NEGATIVE
Leukocytes, UA: NEGATIVE
NITRITE UA: NEGATIVE
PH UA: 6.5
Protein, UA: NEGATIVE
RBC UA: 50
SPEC GRAV UA: 1.015
UROBILINOGEN UA: NEGATIVE

## 2015-05-07 NOTE — Progress Notes (Signed)
  Subjective:    Emma Mcdonald is a 29 y.o. female being seen today for her obstetrical visit. She is at 5818w2d gestation. Patient reports: no complaints.  Is employed as an Print production planneroffice manager.  Has two little girls.    Problem List Items Addressed This Visit    None    Visit Diagnoses    Encounter for supervision of other normal pregnancy in second trimester    -  Primary    Relevant Orders    POCT urinalysis dipstick (Completed)    US OB Comp + 14 Wk    AFP, Quad Screen      Patient Active Problem List   Diagnosis Date Noted  . Normal delivery 07/03/2012    Objective:     BP 99/64 mmHg  Pulse 96  Temp(Src) 98.8 F (37.1 C)  Wt 144 lb (65.318 kg)  LMP 01/27/2015 Uterine Size: Below umbilicus   FHR: 152  Assessment:    Pregnancy @ 7618w2d  weeks Doing well    Plan:    Problem list reviewed and updated. Labs reviewed.  Follow up in 4 weeks. FIRST/CF mutation testing/NIPT/QUAD SCREEN/fragile X/Ashkenazi Jewish population testing/Spinal muscular atrophy discussed: ordered. Role of ultrasound in pregnancy discussed; fetal survey: ordered. Amniocentesis discussed: not indicated. 50% of 15 minute visit spent on counseling and coordination of care.

## 2015-05-12 LAB — AFP, QUAD SCREEN
AFP: 41.4 ng/mL
Age Alone: 1:875 {titer}
CURR GEST AGE: 14.3 wks.days
Down Syndrome Scr Risk Est: 1:7610 {titer}
HCG TOTAL: 65.34 [IU]/mL
INH: 94.2 pg/mL
Interpretation-AFP: NEGATIVE
MOM FOR HCG: 1
MOM FOR INH: 0.48
MoM for AFP: 1.29
Open Spina bifida: NEGATIVE
Osb Risk: 1:10400 {titer}
Tri 18 Scr Risk Est: NEGATIVE
Trisomy 18 (Edward) Syndrome Interp.: 1:10000 {titer}
UE3 MOM: 0.59
uE3 Value: 0.32 ng/mL

## 2015-05-29 ENCOUNTER — Telehealth: Payer: Self-pay | Admitting: *Deleted

## 2015-05-29 NOTE — Telephone Encounter (Signed)
Patient has a sinus headache and wants to know what to take. 2:30 Per Dr Clearance CootsHarper- Patient can use Tylenol with sudafed, Excedrin/sinus or he can Rx Fioricet for her. Patient prefers to use OTC for now- will watch her symptoms and call if she should get worse.

## 2015-06-12 ENCOUNTER — Ambulatory Visit (INDEPENDENT_AMBULATORY_CARE_PROVIDER_SITE_OTHER): Payer: Medicaid Other

## 2015-06-12 ENCOUNTER — Ambulatory Visit (INDEPENDENT_AMBULATORY_CARE_PROVIDER_SITE_OTHER): Payer: Medicaid Other | Admitting: Obstetrics

## 2015-06-12 ENCOUNTER — Encounter: Payer: Self-pay | Admitting: Obstetrics

## 2015-06-12 VITALS — BP 94/66 | HR 81 | Wt 146.0 lb

## 2015-06-12 DIAGNOSIS — Z3482 Encounter for supervision of other normal pregnancy, second trimester: Secondary | ICD-10-CM

## 2015-06-12 DIAGNOSIS — Z36 Encounter for antenatal screening of mother: Secondary | ICD-10-CM

## 2015-06-12 LAB — POCT URINALYSIS DIPSTICK
BILIRUBIN UA: NEGATIVE
GLUCOSE UA: NEGATIVE
KETONES UA: NEGATIVE
Leukocytes, UA: NEGATIVE
NITRITE UA: NEGATIVE
PH UA: 6
Protein, UA: NEGATIVE
RBC UA: 50
SPEC GRAV UA: 1.01
Urobilinogen, UA: NEGATIVE

## 2015-06-12 NOTE — Progress Notes (Signed)
Subjective:    Emma Mcdonald is a 29 y.o. female being seen today for her obstetrical visit. She is at 869w3d gestation. Patient reports: no complaints . Fetal movement: normal.  Problem List Items Addressed This Visit    None    Visit Diagnoses    Encounter for supervision of other normal pregnancy in second trimester    -  Primary    Relevant Orders    POCT urinalysis dipstick (Completed)      Patient Active Problem List   Diagnosis Date Noted  . Normal delivery 07/03/2012   Objective:    BP 94/66 mmHg  Pulse 81  Wt 146 lb (66.225 kg)  LMP 01/27/2015 FHT: 160 BPM  Uterine Size: size equals dates     Assessment:    Pregnancy @ 669w3d  .  Doing well.  Plan:     Labs, problem list reviewed and updated 2 hr GTT planned Follow up in 4 weeks.

## 2015-06-13 ENCOUNTER — Other Ambulatory Visit: Payer: Self-pay | Admitting: Certified Nurse Midwife

## 2015-06-13 ENCOUNTER — Other Ambulatory Visit: Payer: Self-pay | Admitting: Obstetrics

## 2015-06-13 DIAGNOSIS — O358XX1 Maternal care for other (suspected) fetal abnormality and damage, fetus 1: Principal | ICD-10-CM

## 2015-06-13 DIAGNOSIS — IMO0001 Reserved for inherently not codable concepts without codable children: Secondary | ICD-10-CM

## 2015-06-17 ENCOUNTER — Other Ambulatory Visit: Payer: 59

## 2015-06-20 ENCOUNTER — Ambulatory Visit (HOSPITAL_COMMUNITY)
Admission: RE | Admit: 2015-06-20 | Discharge: 2015-06-20 | Disposition: A | Discharge status: Home / Self Care | Payer: Medicaid Other | Source: Ambulatory Visit | Attending: Obstetrics | Admitting: Obstetrics

## 2015-06-20 ENCOUNTER — Other Ambulatory Visit: Payer: Self-pay | Admitting: Obstetrics

## 2015-06-20 VITALS — BP 104/56 | HR 88 | Wt 149.4 lb

## 2015-06-20 DIAGNOSIS — O358XX1 Maternal care for other (suspected) fetal abnormality and damage, fetus 1: Secondary | ICD-10-CM

## 2015-06-20 DIAGNOSIS — Z1389 Encounter for screening for other disorder: Secondary | ICD-10-CM

## 2015-06-20 DIAGNOSIS — O358XX0 Maternal care for other (suspected) fetal abnormality and damage, not applicable or unspecified: Secondary | ICD-10-CM | POA: Diagnosis present

## 2015-06-20 DIAGNOSIS — Z36 Encounter for antenatal screening of mother: Secondary | ICD-10-CM | POA: Diagnosis not present

## 2015-06-20 DIAGNOSIS — IMO0001 Reserved for inherently not codable concepts without codable children: Secondary | ICD-10-CM

## 2015-06-20 DIAGNOSIS — Z3A20 20 weeks gestation of pregnancy: Secondary | ICD-10-CM

## 2015-07-10 ENCOUNTER — Encounter: Payer: Self-pay | Admitting: Obstetrics

## 2015-07-10 ENCOUNTER — Ambulatory Visit (INDEPENDENT_AMBULATORY_CARE_PROVIDER_SITE_OTHER): Payer: Medicaid Other | Admitting: Obstetrics

## 2015-07-10 VITALS — BP 96/64 | HR 95 | Temp 97.6°F | Wt 157.0 lb

## 2015-07-10 DIAGNOSIS — Z3482 Encounter for supervision of other normal pregnancy, second trimester: Secondary | ICD-10-CM

## 2015-07-10 LAB — POCT URINALYSIS DIPSTICK
BILIRUBIN UA: NEGATIVE
GLUCOSE UA: NEGATIVE
Ketones, UA: NEGATIVE
Leukocytes, UA: NEGATIVE
NITRITE UA: NEGATIVE
Protein, UA: NEGATIVE
RBC UA: 50
Spec Grav, UA: 1.015
Urobilinogen, UA: NEGATIVE
pH, UA: 6

## 2015-07-10 NOTE — Progress Notes (Signed)
Subjective:    Emma Mcdonald is a 29 y.o. female being seen today for her obstetrical visit. She is at 3070w3d gestation. Patient reports: no complaints . Fetal movement: normal.  Problem List Items Addressed This Visit    None    Visit Diagnoses    Supervision of other normal pregnancy, antepartum, second trimester    -  Primary    Relevant Orders    POCT urinalysis dipstick (Completed)      Patient Active Problem List   Diagnosis Date Noted  . Normal delivery 07/03/2012   Objective:    BP 96/64 mmHg  Pulse 95  Temp(Src) 97.6 F (36.4 C)  Wt 157 lb (71.215 kg)  LMP 01/27/2015 FHT: 160 BPM  Uterine Size: size equals dates     Assessment:    Pregnancy @ 3270w3d    Plan:    OBGCT: ordered for next visit.  Labs, problem list reviewed and updated 2 hr GTT planned Follow up in 4 weeks.

## 2015-07-13 NOTE — L&D Delivery Note (Signed)
Delivery Note At 11:11 AM a viable female was delivered via  (Presentation: LOA ;  ).  APGAR: 9, 9; weight: 3105 grams  .   Placenta status: Intact, spontaneous delivery, .  Cord: 3-vessel with the following complications: None .  Cord pH: none  Anesthesia: Epidural  Episiotomy: None Lacerations:None  Suture Repair: none Est. Blood Loss (mL): 350  Mom to postpartum.  Baby to Couplet care / Skin to Skin.  Emma Mcdonald A 10/25/2015, 12:06 PM

## 2015-08-01 ENCOUNTER — Ambulatory Visit (HOSPITAL_COMMUNITY)
Admission: RE | Admit: 2015-08-01 | Discharge: 2015-08-01 | Disposition: A | Discharge status: Home / Self Care | Payer: Medicaid Other | Source: Ambulatory Visit | Attending: Obstetrics | Admitting: Obstetrics

## 2015-08-01 ENCOUNTER — Encounter (HOSPITAL_COMMUNITY): Payer: Self-pay

## 2015-08-01 DIAGNOSIS — Z3A26 26 weeks gestation of pregnancy: Secondary | ICD-10-CM | POA: Diagnosis not present

## 2015-08-01 DIAGNOSIS — O358XX0 Maternal care for other (suspected) fetal abnormality and damage, not applicable or unspecified: Secondary | ICD-10-CM | POA: Insufficient documentation

## 2015-08-01 DIAGNOSIS — IMO0001 Reserved for inherently not codable concepts without codable children: Secondary | ICD-10-CM

## 2015-08-07 ENCOUNTER — Other Ambulatory Visit: Payer: Medicaid Other

## 2015-08-07 ENCOUNTER — Encounter: Payer: Self-pay | Admitting: Obstetrics

## 2015-08-07 ENCOUNTER — Ambulatory Visit (INDEPENDENT_AMBULATORY_CARE_PROVIDER_SITE_OTHER): Payer: Medicaid Other | Admitting: Obstetrics

## 2015-08-07 VITALS — BP 105/66 | HR 95 | Temp 98.3°F | Wt 161.0 lb

## 2015-08-07 DIAGNOSIS — Z3483 Encounter for supervision of other normal pregnancy, third trimester: Secondary | ICD-10-CM

## 2015-08-07 DIAGNOSIS — K219 Gastro-esophageal reflux disease without esophagitis: Secondary | ICD-10-CM

## 2015-08-07 LAB — POCT URINALYSIS DIPSTICK
BILIRUBIN UA: NEGATIVE
Blood, UA: 50
GLUCOSE UA: NEGATIVE
Ketones, UA: NEGATIVE
Leukocytes, UA: NEGATIVE
Nitrite, UA: NEGATIVE
Protein, UA: NEGATIVE
SPEC GRAV UA: 1.01
Urobilinogen, UA: NEGATIVE
pH, UA: 6

## 2015-08-07 LAB — CBC
HEMATOCRIT: 33 % — AB (ref 36.0–46.0)
Hemoglobin: 11 g/dL — ABNORMAL LOW (ref 12.0–15.0)
MCH: 31 pg (ref 26.0–34.0)
MCHC: 33.3 g/dL (ref 30.0–36.0)
MCV: 93 fL (ref 78.0–100.0)
MPV: 9.3 fL (ref 8.6–12.4)
Platelets: 256 10*3/uL (ref 150–400)
RBC: 3.55 MIL/uL — ABNORMAL LOW (ref 3.87–5.11)
RDW: 13.4 % (ref 11.5–15.5)
WBC: 13.2 10*3/uL — AB (ref 4.0–10.5)

## 2015-08-07 MED ORDER — OMEPRAZOLE 20 MG PO CPDR: 20.0000 mg | DELAYED_RELEASE_CAPSULE | Freq: Two times a day (BID) | ORAL | Status: DC

## 2015-08-07 NOTE — Progress Notes (Signed)
Subjective:    Emma Mcdonald is a 30 y.o. female being seen today for her obstetrical visit. She is at [redacted]w[redacted]d gestation. Patient reports: heartburn . Fetal movement: normal.  Problem List Items Addressed This Visit    None    Visit Diagnoses    Supervision of other normal pregnancy, antepartum, third trimester    -  Primary    Relevant Orders    POCT urinalysis dipstick    Glucose Tolerance, 2 Hours w/1 Hour    CBC    HIV antibody    RPR    GERD without esophagitis        Relevant Medications    omeprazole (PRILOSEC) 20 MG capsule      Patient Active Problem List   Diagnosis Date Noted  . Normal delivery 07/03/2012   Objective:    BP 105/66 mmHg  Pulse 95  Temp(Src) 98.3 F (36.8 C)  Wt 161 lb (73.029 kg)  LMP 01/27/2015 FHT: 160 BPM  Uterine Size: size equals dates     Assessment:    Pregnancy @ [redacted]w[redacted]d    Plan:    OBGCT: ordered. Signs and symptoms of preterm labor: discussed.  Labs, problem list reviewed and updated 2 hr GTT planned Follow up in 2 weeks.

## 2015-08-07 NOTE — Patient Instructions (Signed)
Heartburn During Pregnancy Heartburn is a burning sensation in the chest caused by stomach acid backing up into the esophagus. Heartburn is common in pregnancy because a certain hormone (progesterone) is released when a woman is pregnant. The progesterone hormone may relax the valve that separates the esophagus from the stomach. This allows acid to go up into the esophagus, causing heartburn. Heartburn may also happen in pregnancy because the enlarging uterus pushes up on the stomach, which pushes more acid into the esophagus. This is especially true in the later stages of pregnancy. Heartburn problems usually go away after giving birth. CAUSES  Heartburn is caused by stomach acid backing up into the esophagus. During pregnancy, this may result from various things, including:   The progesterone hormone.  Changing hormone levels.  The growing uterus pushing stomach acid upward.  Large meals.  Certain foods and drinks.  Exercise.  Increased acid production. SIGNS AND SYMPTOMS   Burning pain in the chest or lower throat.  Bitter taste in the mouth.  Coughing. DIAGNOSIS  Your health care provider will typically diagnose heartburn by taking a careful history of your concern. Blood tests may be done to check for a certain type of bacteria that is associated with heartburn. Sometimes, heartburn is diagnosed by prescribing a heartburn medicine to see if the symptoms improve. In some cases, a procedure called an endoscopy may be done. In this procedure, a tube with a light and a camera on the end (endoscope) is used to examine the esophagus and the stomach. TREATMENT  Treatment will vary depending on the severity of your symptoms. Your health care provider may recommend:  Over-the-counter medicines (antacids, acid reducers) for mild heartburn.  Prescription medicines to decrease stomach acid or to protect your stomach lining.  Certain changes in your diet.  Elevating the head of your bed  by putting blocks under the legs. This helps prevent stomach acid from backing up into the esophagus when you are lying down. HOME CARE INSTRUCTIONS   Only take over-the-counter or prescription medicines as directed by your health care provider.  Raise the head of your bed by putting blocks under the legs if instructed to do so by your health care provider. Sleeping with more pillows is not effective because it only changes the position of your head.  Do not exercise right after eating.  Avoid eating 2-3 hours before bed. Do not lie down right after eating.  Eat small meals throughout the day instead of three large meals.  Identify foods and beverages that make your symptoms worse and avoid them. Foods you may want to avoid include:  Peppers.  Chocolate.  High-fat foods, including fried foods.  Spicy foods.  Garlic and onions.  Citrus fruits, including oranges, grapefruit, lemons, and limes.  Food containing tomatoes or tomato products.  Mint.  Carbonated and caffeinated drinks.  Vinegar. SEEK MEDICAL CARE IF:  You have abdominal pain of any kind.  You feel burning in your upper abdomen or chest, especially after eating or lying down.  You have nausea and vomiting.  Your stomach feels upset after you eat. SEEK IMMEDIATE MEDICAL CARE IF:   You have severe chest pain that goes down your arm or into your jaw or neck.  You feel sweaty, dizzy, or light-headed.  You become short of breath.  You vomit blood.  You have difficulty or pain with swallowing.  You have bloody or black, tarry stools.  You have episodes of heartburn more than 3 times a week,   for more than 2 weeks. MAKE SURE YOU:  Understand these instructions.  Will watch your condition.  Will get help right away if you are not doing well or get worse.   This information is not intended to replace advice given to you by your health care provider. Make sure you discuss any questions you have with  your health care provider.   Document Released: 06/25/2000 Document Revised: 07/19/2014 Document Reviewed: 02/14/2013 Elsevier Interactive Patient Education 2016 [employer]. Preterm Labor Information Preterm labor is when labor starts at less than 37 weeks of pregnancy. The normal length of a pregnancy is 39 to 41 weeks. CAUSES Often, there is no identifiable underlying cause as to why a woman goes into preterm labor. One of the most common known causes of preterm labor is infection. Infections of the uterus, cervix, vagina, amniotic sac, bladder, kidney, or even the lungs (pneumonia) can cause labor to start. Other suspected causes of preterm labor include:   Urogenital infections, such as yeast infections and bacterial vaginosis.   Uterine abnormalities (uterine shape, uterine septum, fibroids, or bleeding from the placenta).   A cervix that has been operated on (it may fail to stay closed).   Malformations in the fetus.   Multiple gestations (twins, triplets, and so on).   Breakage of the amniotic sac.  RISK FACTORS  Having a previous history of preterm labor.   Having premature rupture of membranes (PROM).   Having a placenta that covers the opening of the cervix (placenta previa).   Having a placenta that separates from the uterus (placental abruption).   Having a cervix that is too weak to hold the fetus in the uterus (incompetent cervix).   Having too much fluid in the amniotic sac (polyhydramnios).   Taking illegal drugs or smoking while pregnant.   Not gaining enough weight while pregnant.   Being younger than 70 and older than 29 years old.   Having a low socioeconomic status.   Being African American. SYMPTOMS Signs and symptoms of preterm labor include:   Menstrual-like cramps, abdominal pain, or back pain.  Uterine contractions that are regular, as frequent as six in an hour, regardless of their intensity (may be mild or  painful).  Contractions that start on the top of the uterus and spread down to the lower abdomen and back.   A sense of increased pelvic pressure.   A watery or bloody mucus discharge that comes from the vagina.  TREATMENT Depending on the length of the pregnancy and other circumstances, your health care provider may suggest bed rest. If necessary, there are medicines that can be given to stop contractions and to mature the fetal lungs. If labor happens before 34 weeks of pregnancy, a prolonged hospital stay may be recommended. Treatment depends on the condition of both you and the fetus.  WHAT SHOULD YOU DO IF YOU THINK YOU ARE IN PRETERM LABOR? Call your health care provider right away. You will need to go to the hospital to get checked immediately. HOW CAN YOU PREVENT PRETERM LABOR IN FUTURE PREGNANCIES? You should:   Stop smoking if you smoke.  Maintain healthy weight gain and avoid chemicals and drugs that are not necessary.  Be watchful for any type of infection.  Inform your health care provider if you have a known history of preterm labor.   This information is not intended to replace advice given to you by your health care provider. Make sure you discuss any questions you have with  your health care provider.   Document Released: 09/18/2003 Document Revised: 02/28/2013 Document Reviewed: 07/31/2012 Elsevier Interactive Patient Education [employer]

## 2015-08-08 LAB — RPR

## 2015-08-08 LAB — HIV ANTIBODY (ROUTINE TESTING W REFLEX): HIV 1&2 Ab, 4th Generation: NONREACTIVE

## 2015-08-12 LAB — GLUCOSE TOLERANCE, 2 HOURS W/ 1HR
GLUCOSE, FASTING: 68 mg/dL (ref 65–99)
Glucose, 1 hour: 66 mg/dL — ABNORMAL LOW (ref 70–170)
Glucose, 2 hour: 83 mg/dL (ref 70–139)

## 2015-08-21 ENCOUNTER — Encounter: Payer: Self-pay | Admitting: Obstetrics

## 2015-08-21 ENCOUNTER — Ambulatory Visit (INDEPENDENT_AMBULATORY_CARE_PROVIDER_SITE_OTHER): Payer: Medicaid Other | Admitting: Obstetrics

## 2015-08-21 VITALS — BP 95/65 | HR 100 | Temp 97.5°F | Wt 160.0 lb

## 2015-08-21 DIAGNOSIS — Z3483 Encounter for supervision of other normal pregnancy, third trimester: Secondary | ICD-10-CM

## 2015-08-21 LAB — POCT URINALYSIS DIPSTICK
Bilirubin, UA: NEGATIVE
Blood, UA: 50
Glucose, UA: NEGATIVE
KETONES UA: NEGATIVE
Nitrite, UA: NEGATIVE
PH UA: 7
PROTEIN UA: NEGATIVE
Urobilinogen, UA: NEGATIVE

## 2015-08-21 NOTE — Addendum Note (Signed)
Addended by: Henriette Combs on: 08/21/2015 05:16 PM   Modules accepted: Orders

## 2015-08-21 NOTE — Progress Notes (Signed)
Subjective:    Emma Mcdonald is a 30 y.o. female being seen today for her obstetrical visit. She is at [redacted]w[redacted]d gestation. Patient reports no complaints. Fetal movement: normal.  Problem List Items Addressed This Visit    None     Patient Active Problem List   Diagnosis Date Noted  . Normal delivery 07/03/2012   Objective:    BP 95/65 mmHg  Pulse 100  Temp(Src) 97.5 F (36.4 C)  Wt 160 lb (72.576 kg)  LMP 01/27/2015 FHT:  160 BPM  Uterine Size: size equals dates  Presentation: unsure     Assessment:    Pregnancy @ [redacted]w[redacted]d weeks   Plan:     labs reviewed, problem list updated Consent signed. GBS sent TDAP offered  Rhogam given for RH negative Pediatrician: discussed. Infant feeding: plans to breastfeed. Maternity leave: discussed. Cigarette smoking: never smoked. No orders of the defined types were placed in this encounter.   No orders of the defined types were placed in this encounter.   Follow up in 2 Weeks.

## 2015-09-03 ENCOUNTER — Inpatient Hospital Stay (HOSPITAL_COMMUNITY): Payer: Medicaid Other

## 2015-09-03 ENCOUNTER — Encounter (HOSPITAL_COMMUNITY): Payer: Self-pay | Admitting: Cardiology

## 2015-09-03 ENCOUNTER — Inpatient Hospital Stay (HOSPITAL_COMMUNITY)
Admission: EM | Admit: 2015-09-03 | Discharge: 2015-09-03 | Disposition: A | Discharge status: Home / Self Care | Payer: Medicaid Other | Attending: Obstetrics | Admitting: Obstetrics

## 2015-09-03 DIAGNOSIS — O26893 Other specified pregnancy related conditions, third trimester: Secondary | ICD-10-CM | POA: Insufficient documentation

## 2015-09-03 DIAGNOSIS — Z041 Encounter for examination and observation following transport accident: Secondary | ICD-10-CM | POA: Insufficient documentation

## 2015-09-03 DIAGNOSIS — T1490 Injury, unspecified: Secondary | ICD-10-CM | POA: Diagnosis not present

## 2015-09-03 DIAGNOSIS — Z3A31 31 weeks gestation of pregnancy: Secondary | ICD-10-CM | POA: Insufficient documentation

## 2015-09-03 DIAGNOSIS — Z3490 Encounter for supervision of normal pregnancy, unspecified, unspecified trimester: Secondary | ICD-10-CM

## 2015-09-03 DIAGNOSIS — O9A213 Injury, poisoning and certain other consequences of external causes complicating pregnancy, third trimester: Secondary | ICD-10-CM

## 2015-09-03 HISTORY — DX: Other specified health status: Z78.9

## 2015-09-03 LAB — CBC WITH DIFFERENTIAL/PLATELET
Basophils Absolute: 0 10*3/uL (ref 0.0–0.1)
Basophils Relative: 0 %
Eosinophils Absolute: 0.1 10*3/uL (ref 0.0–0.7)
Eosinophils Relative: 1 %
HCT: 33.1 % — ABNORMAL LOW (ref 36.0–46.0)
Hemoglobin: 10.8 g/dL — ABNORMAL LOW (ref 12.0–15.0)
Lymphocytes Relative: 13 %
Lymphs Abs: 1.8 10*3/uL (ref 0.7–4.0)
MCH: 30.3 pg (ref 26.0–34.0)
MCHC: 32.6 g/dL (ref 30.0–36.0)
MCV: 92.7 fL (ref 78.0–100.0)
Monocytes Absolute: 0.9 10*3/uL (ref 0.1–1.0)
Monocytes Relative: 6 %
Neutro Abs: 11.2 10*3/uL — ABNORMAL HIGH (ref 1.7–7.7)
Neutrophils Relative %: 80 %
Platelets: 227 10*3/uL (ref 150–400)
RBC: 3.57 MIL/uL — ABNORMAL LOW (ref 3.87–5.11)
RDW: 13.7 % (ref 11.5–15.5)
WBC: 14 10*3/uL — ABNORMAL HIGH (ref 4.0–10.5)

## 2015-09-03 LAB — BASIC METABOLIC PANEL
Anion gap: 9 (ref 5–15)
BUN: <5 mg/dL — ABNORMAL LOW (ref 6–20)
CO2: 20 mmol/L — ABNORMAL LOW (ref 22–32)
Calcium: 8.7 mg/dL — ABNORMAL LOW (ref 8.9–10.3)
Chloride: 109 mmol/L (ref 101–111)
Creatinine, Ser: 0.5 mg/dL (ref 0.44–1.00)
GFR calc Af Amer: >60 mL/min (ref >60)
GFR calc non Af Amer: >60 mL/min (ref >60)
Glucose, Bld: 78 mg/dL (ref 65–99)
Potassium: 3.8 mmol/L (ref 3.5–5.1)
Sodium: 138 mmol/L (ref 135–145)

## 2015-09-03 LAB — FETAL FIBRONECTIN: Fetal Fibronectin: NEGATIVE

## 2015-09-03 LAB — WET PREP, GENITAL
Clue Cells Wet Prep HPF POC: NONE SEEN
SPERM: NONE SEEN
TRICH WET PREP: NONE SEEN
Yeast Wet Prep HPF POC: NONE SEEN

## 2015-09-03 MED ORDER — LACTATED RINGERS IV SOLN
INTRAVENOUS | Status: DC
Start: 2015-09-03 — End: 2015-09-03
  Administered 2015-09-03: 13:00:00 via INTRAVENOUS

## 2015-09-03 MED ORDER — SODIUM CHLORIDE 0.9 % IV BOLUS (SEPSIS)
1000.0000 mL | Freq: Once | INTRAVENOUS | Status: AC
  Administered 2015-09-03: 1000 mL via INTRAVENOUS

## 2015-09-03 NOTE — ED Notes (Signed)
[facility] arrived and transferring pt.

## 2015-09-03 NOTE — ED Notes (Signed)
OB RN at the bedside
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
## 2015-09-03 NOTE — ED Notes (Signed)
Paged OB Rapid Response notified
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
## 2015-09-03 NOTE — ED Notes (Signed)
Phlebotomy at the bedside  

## 2015-09-03 NOTE — ED Notes (Signed)
PA at the bedside.

## 2015-09-03 NOTE — Discharge Instructions (Signed)
What Do I Need to Know About Injuries During Pregnancy? °Trauma is the most common cause of injury and death in pregnant women. This can also result in significant harm or death of the baby. °Your baby is protected in the womb (uterus) by a sac filled with fluid (amniotic sac). Your baby can be harmed if there is direct, high-impact trauma to your abdomen and pelvis. This type of trauma can result in tearing of your uterus, the placenta pulling away from the wall of the uterus (placenta abruption), or the amniotic sac breaking open (rupture of membranes). These injuries can decrease or stop the blood supply to your baby or cause you to go into labor earlier than expected. Minor falls and low-impact automobile accidents do not usually harm your baby, even if they do minimally harm you. °WHAT KIND OF INJURIES CAN AFFECT MY PREGNANCY? °The most common causes of injury or death to a baby include: °· Falls. Falls are more common in the second and third trimester of the pregnancy. Factors that increase your risk of falling include: °¨ Increase in your weight. °¨ The change in your center of gravity. °¨ Tripping over an object that cannot be seen. °¨ Increased looseness (laxity) of your ligaments resulting in less coordinated movements (you may feel clumsy). °¨ Falling during high-risk activities like horseback riding or skiing. °· Automobile accidents. It is important to wear your seat belt properly, with the lap belt below your abdomen, and always practice safe driving. °· Domestic violence or assault. °· Burns (fire or electrical). °The most common causes of injury or death to the pregnant woman include: °· Injuries that cause severe bleeding, shock, and loss of blood flow to major organs. °· Head and neck injuries that result in severe brain or spinal damage. °· Chest trauma that can cause direct injury to the heart and lungs or any injury that affects the area enclosed by the ribs. Trauma to this area can result in  cardiorespiratory arrest. °WHAT CAN I DO TO PROTECT MYSELF AND MY BABY FROM INJURY WHILE I AM PREGNANT? °· Remove slippery rugs and loose objects on the floor that increase your risk of tripping. °· Avoid walking on wet or slippery floors. °· Wear comfortable shoes that have a good grip on the sole. Do not wear high-heeled shoes. °· Always wear your seat belt properly, with the lap belt below your abdomen, and always practice safe driving. Do not ride on a motorcycle while pregnant. °· Do not participate in high-impact activities or sports. °· Avoid fires, starting fires, lifting heavy pots of boiling or hot liquids, and fixing electrical problems. °· Only take over-the-counter or prescription medicines for pain, fever, or discomfort as directed by your health care provider. °· Know your blood type and the father's blood type in case you develop vaginal bleeding or experience an injury for which a blood transfusion may be necessary. °· Call your local emergency services (911 in the [place] if you are a victim of domestic violence or assault. Spousal abuse can be a significant cause of trauma during pregnancy. For help and support, contact the National Domestic Violence Hotline. °WHEN SHOULD I SEEK IMMEDIATE MEDICAL CARE?  °· You fall on your abdomen or experience any high-force accident or injury. °· You have been assaulted (domestic or otherwise). °· You have been in a car accident. °· You develop vaginal bleeding. °· You develop fluid leaking from the vagina. °· You develop uterine contractions (pelvic cramping, pain, or significant low back   pain). °· You become weak or faint, or have uncontrolled vomiting after trauma. °· You had a serious burn. This includes burns to the face, neck, hands, or genitals, or burns greater than the size of your palm anywhere else. °· You develop neck stiffness or pain after a fall or from other trauma. °· You develop a headache or vision problems after a fall or from other  trauma. °· You do not feel the baby moving or the baby is not moving as much as before a fall or other trauma. °  °This information is not intended to replace advice given to you by your health care provider. Make sure you discuss any questions you have with your health care provider. °  °Document Released: 08/05/2004 Document Revised: 07/19/2014 Document Reviewed: 04/04/2013 °Elsevier Interactive Patient Education ©2016 [employer] ° °
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
## 2015-09-04 ENCOUNTER — Encounter: Payer: Self-pay | Admitting: *Deleted

## 2015-09-04 ENCOUNTER — Ambulatory Visit (INDEPENDENT_AMBULATORY_CARE_PROVIDER_SITE_OTHER): Payer: Medicaid Other | Admitting: Obstetrics

## 2015-09-04 VITALS — BP 100/64 | HR 110 | Wt 165.0 lb

## 2015-09-04 DIAGNOSIS — O368993 Maternal care for other specified fetal problems, unspecified trimester, fetus 3: Secondary | ICD-10-CM | POA: Diagnosis not present

## 2015-09-04 DIAGNOSIS — Z3483 Encounter for supervision of other normal pregnancy, third trimester: Secondary | ICD-10-CM | POA: Diagnosis not present

## 2015-09-04 LAB — POCT URINALYSIS DIPSTICK
Bilirubin, UA: NEGATIVE
Glucose, UA: NEGATIVE
Ketones, UA: NEGATIVE
Leukocytes, UA: NEGATIVE
NITRITE UA: NEGATIVE
PH UA: 6
PROTEIN UA: NEGATIVE
RBC UA: 50
SPEC GRAV UA: 1.01
UROBILINOGEN UA: NEGATIVE

## 2015-09-05 ENCOUNTER — Encounter: Payer: Self-pay | Admitting: Obstetrics

## 2015-09-05 NOTE — Progress Notes (Signed)
Subjective:    Emma Mcdonald is a 30 y.o. female being seen today for her obstetrical visit. She is at [redacted]w[redacted]d gestation. Patient reports no complaints. Fetal movement: normal.  Problem List Items Addressed This Visit    None    Visit Diagnoses    Encounter for supervision of other normal pregnancy in third trimester    -  Primary    Relevant Orders    POCT urinalysis dipstick (Completed)      Patient Active Problem List   Diagnosis Date Noted  . Normal delivery 07/03/2012   Objective:    BP 100/64 mmHg  Pulse 110  Wt 165 lb (74.844 kg)  LMP 01/27/2015 FHT:  160 BPM  Uterine Size: size equals dates  Presentation: unsure     Assessment:    Pregnancy @ [redacted]w[redacted]d weeks   Plan:     labs reviewed, problem list updated Consent signed. GBS sent TDAP offered  Rhogam given for RH negative Pediatrician: discussed. Infant feeding: plans to breastfeed. Maternity leave: discussed. Cigarette smoking: never smoked. Orders Placed This Encounter  Procedures  . POCT urinalysis dipstick   No orders of the defined types were placed in this encounter.   Follow up in 2 Weeks.

## 2015-09-11 ENCOUNTER — Encounter: Payer: Self-pay | Admitting: Obstetrics

## 2015-09-11 ENCOUNTER — Ambulatory Visit (INDEPENDENT_AMBULATORY_CARE_PROVIDER_SITE_OTHER): Payer: Medicaid Other | Admitting: Obstetrics

## 2015-09-11 VITALS — BP 99/64 | HR 95 | Temp 98.2°F | Wt 163.0 lb

## 2015-09-11 DIAGNOSIS — Z3483 Encounter for supervision of other normal pregnancy, third trimester: Secondary | ICD-10-CM

## 2015-09-11 NOTE — Progress Notes (Signed)
Subjective:    Emma Mcdonald is a 30 y.o. female being seen today for her obstetrical visit. She is at [redacted]w[redacted]d gestation. Patient reports no complaints. Fetal movement: normal.  Problem List Items Addressed This Visit    None     Patient Active Problem List   Diagnosis Date Noted  . Normal delivery 07/03/2012   Objective:    BP 99/64 mmHg  Pulse 95  Temp(Src) 98.2 F (36.8 C)  Wt 163 lb (73.936 kg)  LMP 01/27/2015 FHT:  160 BPM  Uterine Size: size equals dates  Presentation: unsure     Assessment:    Pregnancy @ [redacted]w[redacted]d weeks   Plan:     labs reviewed, problem list updated Consent signed. GBS sent TDAP offered  Rhogam given for RH negative Pediatrician: discussed. Infant feeding: plans to breastfeed. Maternity leave: discussed. Cigarette smoking: never smoked. No orders of the defined types were placed in this encounter.   No orders of the defined types were placed in this encounter.   Follow up in 2 Weeks.

## 2015-09-17 ENCOUNTER — Telehealth: Payer: Self-pay | Admitting: *Deleted

## 2015-09-17 NOTE — Telephone Encounter (Signed)
Patient took her daughter to the doctor today and she was diagnosed with the flu. He told her to call us for treatment.

## 2015-09-18 ENCOUNTER — Other Ambulatory Visit: Payer: Self-pay | Admitting: Obstetrics

## 2015-09-18 DIAGNOSIS — J111 Influenza due to unidentified influenza virus with other respiratory manifestations: Secondary | ICD-10-CM

## 2015-09-18 MED ORDER — OSELTAMIVIR PHOSPHATE 75 MG PO CAPS
75.0000 mg | ORAL_CAPSULE | Freq: Every day | ORAL | Status: DC
Start: 2015-09-18 — End: 2015-09-19

## 2015-09-19 ENCOUNTER — Other Ambulatory Visit: Payer: Self-pay | Admitting: Obstetrics

## 2015-09-19 DIAGNOSIS — J111 Influenza due to unidentified influenza virus with other respiratory manifestations: Secondary | ICD-10-CM

## 2015-09-19 MED ORDER — OSELTAMIVIR PHOSPHATE 75 MG PO CAPS: 75.0000 mg | ORAL_CAPSULE | Freq: Every day | ORAL | Status: DC

## 2015-09-19 NOTE — Telephone Encounter (Signed)
Tamiflu Rx 

## 2015-09-24 ENCOUNTER — Ambulatory Visit (INDEPENDENT_AMBULATORY_CARE_PROVIDER_SITE_OTHER): Payer: Medicaid Other | Admitting: Obstetrics

## 2015-09-24 DIAGNOSIS — Z3483 Encounter for supervision of other normal pregnancy, third trimester: Secondary | ICD-10-CM

## 2015-09-24 LAB — POCT URINALYSIS DIPSTICK
Bilirubin, UA: NEGATIVE
Blood, UA: 10
GLUCOSE UA: 500
Ketones, UA: 50
Leukocytes, UA: NEGATIVE
NITRITE UA: NEGATIVE
PROTEIN UA: NEGATIVE
Spec Grav, UA: 1.025
UROBILINOGEN UA: NEGATIVE
pH, UA: 5

## 2015-09-25 ENCOUNTER — Encounter: Payer: Self-pay | Admitting: Obstetrics

## 2015-09-25 LAB — URINE CULTURE

## 2015-09-25 NOTE — Progress Notes (Signed)
Subjective:    Emma Mcdonald is a 30 y.o. female being seen today for her obstetrical visit. She is at 2347w3d gestation. Patient reports no complaints. Fetal movement: normal.  Problem List Items Addressed This Visit    None    Visit Diagnoses    Supervision of other normal pregnancy, antepartum, third trimester        Relevant Orders    POCT urinalysis dipstick (Completed)    Urine culture      Patient Active Problem List   Diagnosis Date Noted  . Normal delivery 07/03/2012   Objective:    BP 109/66 mmHg  Pulse 114  Temp(Src) 98 F (36.7 C)  Wt 169 lb (76.658 kg)  LMP 01/27/2015 FHT:  160 BPM  Uterine Size: size equals dates  Presentation: unsure     Assessment:    Pregnancy @ 1047w3d weeks   Plan:     labs reviewed, problem list updated Consent signed. GBS sent TDAP offered  Rhogam given for RH negative Pediatrician: discussed. Infant feeding: plans to breastfeed. Maternity leave: discussed. Cigarette smoking: never smoked. Orders Placed This Encounter  Procedures  . Urine culture  . POCT urinalysis dipstick   No orders of the defined types were placed in this encounter.   Follow up in 1 Week.

## 2015-10-02 ENCOUNTER — Ambulatory Visit (INDEPENDENT_AMBULATORY_CARE_PROVIDER_SITE_OTHER): Payer: Medicaid Other | Admitting: Obstetrics

## 2015-10-02 ENCOUNTER — Encounter: Payer: Self-pay | Admitting: Obstetrics

## 2015-10-02 VITALS — BP 108/73 | HR 108 | Temp 97.3°F | Wt 171.0 lb

## 2015-10-02 DIAGNOSIS — Z3483 Encounter for supervision of other normal pregnancy, third trimester: Secondary | ICD-10-CM

## 2015-10-02 LAB — POCT URINALYSIS DIPSTICK
Bilirubin, UA: NEGATIVE
GLUCOSE UA: NEGATIVE
Ketones, UA: NEGATIVE
Leukocytes, UA: NEGATIVE
NITRITE UA: NEGATIVE
PH UA: 6
Protein, UA: NEGATIVE
RBC UA: NEGATIVE
Spec Grav, UA: 1.01
UROBILINOGEN UA: NEGATIVE

## 2015-10-02 LAB — OB RESULTS CONSOLE GBS: GBS: NEGATIVE

## 2015-10-02 NOTE — Progress Notes (Signed)
Subjective:    Emma Mcdonald is a 30 y.o. female being seen today for her obstetrical visit. She is at 4740w3d gestation. Patient reports no complaints. Fetal movement: normal.  Problem List Items Addressed This Visit    None    Visit Diagnoses    Supervision of other normal pregnancy, antepartum, third trimester    -  Primary    Relevant Orders    POCT urinalysis dipstick    Strep Gp B NAA      Patient Active Problem List   Diagnosis Date Noted  . Normal delivery 07/03/2012   Objective:    BP 108/73 mmHg  Pulse 108  Temp(Src) 97.3 F (36.3 C)  Wt 171 lb (77.565 kg)  LMP 01/27/2015 FHT:  160 BPM  Uterine Size: size equals dates  Presentation: unsure     Assessment:    Pregnancy @ 5340w3d weeks   Plan:     labs reviewed, problem list updated Consent signed. GBS sent TDAP offered  Rhogam given for RH negative Pediatrician: discussed. Infant feeding: plans to breastfeed. Maternity leave: discussed. Cigarette smoking: never smoked. Orders Placed This Encounter  Procedures  . Strep Gp B NAA  . POCT urinalysis dipstick   No orders of the defined types were placed in this encounter.   Follow up in 1 Week.

## 2015-10-04 LAB — STREP GP B NAA: STREP GROUP B AG: NEGATIVE

## 2015-10-06 ENCOUNTER — Other Ambulatory Visit: Payer: Self-pay | Admitting: Certified Nurse Midwife

## 2015-10-08 ENCOUNTER — Ambulatory Visit (INDEPENDENT_AMBULATORY_CARE_PROVIDER_SITE_OTHER): Payer: Medicaid Other | Admitting: Obstetrics

## 2015-10-08 VITALS — BP 100/68 | HR 105 | Wt 170.0 lb

## 2015-10-08 DIAGNOSIS — Z3483 Encounter for supervision of other normal pregnancy, third trimester: Secondary | ICD-10-CM

## 2015-10-08 LAB — POCT URINALYSIS DIPSTICK
BILIRUBIN UA: NEGATIVE
Blood, UA: NEGATIVE
GLUCOSE UA: NEGATIVE
Ketones, UA: NEGATIVE
LEUKOCYTES UA: NEGATIVE
NITRITE UA: NEGATIVE
Protein, UA: NEGATIVE
Spec Grav, UA: 1.01
Urobilinogen, UA: NEGATIVE
pH, UA: 6.5

## 2015-10-09 ENCOUNTER — Encounter: Payer: Self-pay | Admitting: Obstetrics

## 2015-10-09 NOTE — Progress Notes (Signed)
Subjective:    Emma Mcdonald is a 30 y.o. female being seen today for her obstetrical visit. She is at 3782w3d gestation. Patient reports no complaints. Fetal movement: normal.  Problem List Items Addressed This Visit    None    Visit Diagnoses    Encounter for supervision of other normal pregnancy in third trimester    -  Primary    Relevant Orders    POCT urinalysis dipstick (Completed)      Patient Active Problem List   Diagnosis Date Noted  . Normal delivery 07/03/2012   Objective:    BP 100/68 mmHg  Pulse 105  Wt 170 lb (77.111 kg)  LMP 01/27/2015 FHT:  160 BPM  Uterine Size: size equals dates  Presentation: unsure     Assessment:    Pregnancy @ 9182w3d weeks   Plan:     labs reviewed, problem list updated Consent signed. GBS sent TDAP offered  Rhogam given for RH negative Pediatrician: discussed. Infant feeding: plans to breastfeed. Maternity leave: discussed. Cigarette smoking: never smoked. Orders Placed This Encounter  Procedures  . POCT urinalysis dipstick   No orders of the defined types were placed in this encounter.   Follow up in 1 Week.

## 2015-10-10 ENCOUNTER — Ambulatory Visit (HOSPITAL_COMMUNITY)
Admission: RE | Admit: 2015-10-10 | Discharge: 2015-10-10 | Disposition: A | Discharge status: Home / Self Care | Payer: Medicaid Other | Source: Ambulatory Visit | Attending: Obstetrics | Admitting: Obstetrics

## 2015-10-10 ENCOUNTER — Encounter (HOSPITAL_COMMUNITY): Payer: Self-pay

## 2015-10-10 DIAGNOSIS — IMO0001 Reserved for inherently not codable concepts without codable children: Secondary | ICD-10-CM

## 2015-10-10 DIAGNOSIS — O358XX0 Maternal care for other (suspected) fetal abnormality and damage, not applicable or unspecified: Secondary | ICD-10-CM | POA: Insufficient documentation

## 2015-10-10 DIAGNOSIS — Z3A36 36 weeks gestation of pregnancy: Secondary | ICD-10-CM | POA: Diagnosis not present

## 2015-10-15 ENCOUNTER — Ambulatory Visit (INDEPENDENT_AMBULATORY_CARE_PROVIDER_SITE_OTHER): Payer: Medicaid Other | Admitting: Obstetrics

## 2015-10-15 VITALS — BP 103/67 | HR 97 | Temp 98.3°F

## 2015-10-15 DIAGNOSIS — Z3493 Encounter for supervision of normal pregnancy, unspecified, third trimester: Secondary | ICD-10-CM

## 2015-10-15 LAB — POCT URINALYSIS DIPSTICK
BILIRUBIN UA: NEGATIVE
GLUCOSE UA: 50
Ketones, UA: NEGATIVE
Leukocytes, UA: NEGATIVE
Nitrite, UA: NEGATIVE
Protein, UA: NEGATIVE
RBC UA: NEGATIVE
SPEC GRAV UA: 1.015
Urobilinogen, UA: NEGATIVE
pH, UA: 6

## 2015-10-15 NOTE — Progress Notes (Signed)
Patient has no concerns 

## 2015-10-16 ENCOUNTER — Encounter: Payer: Self-pay | Admitting: Obstetrics

## 2015-10-16 NOTE — Progress Notes (Signed)
Subjective:    Emma Mcdonald is a 30 y.o. female being seen today for her obstetrical visit. She is at 783w3d gestation. Patient reports no complaints. Fetal movement: normal.  Problem List Items Addressed This Visit    None    Visit Diagnoses    Prenatal care, third trimester    -  Primary    Relevant Orders    POCT urinalysis dipstick (Completed)      Patient Active Problem List   Diagnosis Date Noted  . Normal delivery 07/03/2012    Objective:    BP 103/67 mmHg  Pulse 97  Temp(Src) 98.3 F (36.8 C)  LMP 01/27/2015 FHT: 160 BPM  Uterine Size: size equals dates  Presentations: unsure    Assessment:    Pregnancy @ 203w3d weeks   Plan:   Plans for delivery: Vaginal anticipated; labs reviewed; problem list updated Counseling: Consent signed. Infant feeding: plans to breastfeed. Cigarette smoking: never smoked. L&D discussion: symptoms of labor, discussed when to call, discussed what number to call, anesthetic/analgesic options reviewed and delivering clinician:  plans no preference. Postpartum supports and preparation: circumcision discussed and contraception plans discussed.  Follow up in 1 Week.

## 2015-10-22 ENCOUNTER — Ambulatory Visit (INDEPENDENT_AMBULATORY_CARE_PROVIDER_SITE_OTHER): Payer: Medicaid Other | Admitting: Obstetrics

## 2015-10-22 VITALS — BP 97/64 | HR 103 | Wt 172.0 lb

## 2015-10-22 DIAGNOSIS — J111 Influenza due to unidentified influenza virus with other respiratory manifestations: Secondary | ICD-10-CM

## 2015-10-22 DIAGNOSIS — Z3483 Encounter for supervision of other normal pregnancy, third trimester: Secondary | ICD-10-CM

## 2015-10-22 MED ORDER — OSELTAMIVIR PHOSPHATE 75 MG PO CAPS: 75.0000 mg | ORAL_CAPSULE | Freq: Every day | ORAL | Status: DC

## 2015-10-23 ENCOUNTER — Encounter: Payer: Self-pay | Admitting: Obstetrics

## 2015-10-23 NOTE — Progress Notes (Signed)
Subjective:    Ranesha Little Laural BenesJohnson is a 30 y.o. female being seen today for her obstetrical visit. She is at 5836w3d gestation. Patient reports no complaints. Fetal movement: normal.  Problem List Items Addressed This Visit    None    Visit Diagnoses    Encounter for supervision of other normal pregnancy in third trimester    -  Primary    Relevant Orders    POCT urinalysis dipstick    Flu        Relevant Medications    oseltamivir (TAMIFLU) 75 MG capsule      Patient Active Problem List   Diagnosis Date Noted  . Normal delivery 07/03/2012    Objective:    BP 97/64 mmHg  Pulse 103  Wt 172 lb (78.019 kg)  LMP 01/27/2015 FHT: 160 BPM  Uterine Size: size equals dates  Presentations: unsure    Assessment:    Pregnancy @ 2736w3d weeks   Plan:   Plans for delivery: Vaginal anticipated; labs reviewed; problem list updated Counseling: Consent signed. Infant feeding: plans to breastfeed. Cigarette smoking: never smoked. L&D discussion: symptoms of labor, discussed when to call, discussed what number to call, anesthetic/analgesic options reviewed and delivering clinician:  plans no preference. Postpartum supports and preparation: circumcision discussed and contraception plans discussed.  Follow up in 1 Week.

## 2015-10-25 ENCOUNTER — Inpatient Hospital Stay (HOSPITAL_COMMUNITY)
Admission: AD | Admit: 2015-10-25 | Discharge: 2015-10-26 | DRG: 775 | Disposition: A | Discharge status: Home / Self Care | Payer: Medicaid Other | Source: Ambulatory Visit | Attending: Obstetrics | Admitting: Obstetrics

## 2015-10-25 ENCOUNTER — Inpatient Hospital Stay (HOSPITAL_COMMUNITY): Payer: Medicaid Other | Admitting: Anesthesiology

## 2015-10-25 ENCOUNTER — Encounter (HOSPITAL_COMMUNITY): Payer: Self-pay | Admitting: *Deleted

## 2015-10-25 DIAGNOSIS — Z3A38 38 weeks gestation of pregnancy: Secondary | ICD-10-CM | POA: Diagnosis not present

## 2015-10-25 DIAGNOSIS — IMO0001 Reserved for inherently not codable concepts without codable children: Secondary | ICD-10-CM

## 2015-10-25 LAB — CBC
HCT: 31.4 % — ABNORMAL LOW (ref 36.0–46.0)
HEMOGLOBIN: 10.1 g/dL — AB (ref 12.0–15.0)
MCH: 28.5 pg (ref 26.0–34.0)
MCHC: 32.2 g/dL (ref 30.0–36.0)
MCV: 88.5 fL (ref 78.0–100.0)
Platelets: 267 10*3/uL (ref 150–400)
RBC: 3.55 MIL/uL — ABNORMAL LOW (ref 3.87–5.11)
RDW: 15.3 % (ref 11.5–15.5)
WBC: 14.4 10*3/uL — ABNORMAL HIGH (ref 4.0–10.5)

## 2015-10-25 LAB — RPR: RPR Ser Ql: NONREACTIVE

## 2015-10-25 LAB — TYPE AND SCREEN
ABO/RH(D): O POS
Antibody Screen: NEGATIVE

## 2015-10-25 LAB — ABO/RH: ABO/RH(D): O POS

## 2015-10-25 LAB — HIV ANTIBODY (ROUTINE TESTING W REFLEX): HIV SCREEN 4TH GENERATION: NONREACTIVE

## 2015-10-25 MED ORDER — COCONUT OIL OIL: 1.0000 "application " | TOPICAL_OIL | Status: DC | PRN

## 2015-10-25 MED ORDER — LIDOCAINE HCL (PF) 1 % IJ SOLN
INTRAMUSCULAR | Status: DC | PRN
  Administered 2015-10-25 (×2): 5 mL

## 2015-10-25 MED ORDER — IBUPROFEN 600 MG PO TABS
600.0000 mg | ORAL_TABLET | Freq: Four times a day (QID) | ORAL | Status: DC
  Administered 2015-10-25: 600 mg via ORAL
  Filled 2015-10-25: qty 1

## 2015-10-25 MED ORDER — ONDANSETRON HCL 4 MG/2ML IJ SOLN: 4.0000 mg | Freq: Four times a day (QID) | INTRAMUSCULAR | Status: DC | PRN

## 2015-10-25 MED ORDER — TETANUS-DIPHTH-ACELL PERTUSSIS 5-2.5-18.5 LF-MCG/0.5 IM SUSP: 0.5000 mL | Freq: Once | INTRAMUSCULAR | Status: DC

## 2015-10-25 MED ORDER — DIPHENHYDRAMINE HCL 25 MG PO CAPS: 25.0000 mg | ORAL_CAPSULE | Freq: Four times a day (QID) | ORAL | Status: DC | PRN

## 2015-10-25 MED ORDER — OXYCODONE-ACETAMINOPHEN 5-325 MG PO TABS: 2.0000 | ORAL_TABLET | ORAL | Status: DC | PRN

## 2015-10-25 MED ORDER — SIMETHICONE 80 MG PO CHEW: 80.0000 mg | CHEWABLE_TABLET | ORAL | Status: DC | PRN

## 2015-10-25 MED ORDER — PHENYLEPHRINE 40 MCG/ML (10ML) SYRINGE FOR IV PUSH (FOR BLOOD PRESSURE SUPPORT)
80.0000 ug | PREFILLED_SYRINGE | INTRAVENOUS | Status: DC | PRN
  Filled 2015-10-25: qty 2
  Filled 2015-10-25: qty 20

## 2015-10-25 MED ORDER — PRENATAL MULTIVITAMIN CH
1.0000 | ORAL_TABLET | Freq: Every day | ORAL | Status: DC
  Administered 2015-10-26: 1 via ORAL
  Filled 2015-10-25: qty 1

## 2015-10-25 MED ORDER — ACETAMINOPHEN 325 MG PO TABS: 650.0000 mg | ORAL_TABLET | ORAL | Status: DC | PRN

## 2015-10-25 MED ORDER — CITRIC ACID-SODIUM CITRATE 334-500 MG/5ML PO SOLN: 30.0000 mL | ORAL | Status: DC | PRN

## 2015-10-25 MED ORDER — BENZOCAINE-MENTHOL 20-0.5 % EX AERO: 1.0000 "application " | INHALATION_SPRAY | CUTANEOUS | Status: DC | PRN

## 2015-10-25 MED ORDER — IBUPROFEN 600 MG PO TABS
600.0000 mg | ORAL_TABLET | Freq: Four times a day (QID) | ORAL | Status: DC
  Administered 2015-10-25 – 2015-10-26 (×4): 600 mg via ORAL
  Filled 2015-10-25 (×4): qty 1

## 2015-10-25 MED ORDER — OXYCODONE-ACETAMINOPHEN 5-325 MG PO TABS: 1.0000 | ORAL_TABLET | ORAL | Status: DC | PRN

## 2015-10-25 MED ORDER — EPHEDRINE 5 MG/ML INJ
10.0000 mg | INTRAVENOUS | Status: DC | PRN
  Filled 2015-10-25: qty 2

## 2015-10-25 MED ORDER — WITCH HAZEL-GLYCERIN EX PADS: 1.0000 "application " | MEDICATED_PAD | CUTANEOUS | Status: DC | PRN

## 2015-10-25 MED ORDER — ZOLPIDEM TARTRATE 5 MG PO TABS: 5.0000 mg | ORAL_TABLET | Freq: Every evening | ORAL | Status: DC | PRN

## 2015-10-25 MED ORDER — ONDANSETRON HCL 4 MG/2ML IJ SOLN: 4.0000 mg | INTRAMUSCULAR | Status: DC | PRN

## 2015-10-25 MED ORDER — LACTATED RINGERS IV SOLN
500.0000 mL | Freq: Once | INTRAVENOUS | Status: AC
  Administered 2015-10-25: 1000 mL via INTRAVENOUS

## 2015-10-25 MED ORDER — DIBUCAINE 1 % RE OINT: 1.0000 "application " | TOPICAL_OINTMENT | RECTAL | Status: DC | PRN

## 2015-10-25 MED ORDER — LIDOCAINE HCL (PF) 1 % IJ SOLN
30.0000 mL | INTRAMUSCULAR | Status: DC | PRN
  Filled 2015-10-25: qty 30

## 2015-10-25 MED ORDER — ONDANSETRON HCL 4 MG PO TABS: 4.0000 mg | ORAL_TABLET | ORAL | Status: DC | PRN

## 2015-10-25 MED ORDER — OXYTOCIN 10 UNIT/ML IJ SOLN: 2.5000 [IU]/h | INTRAVENOUS | Status: DC | PRN

## 2015-10-25 MED ORDER — LACTATED RINGERS IV SOLN: 500.0000 mL | INTRAVENOUS | Status: DC | PRN

## 2015-10-25 MED ORDER — OXYTOCIN 10 UNIT/ML IJ SOLN
2.5000 [IU]/h | INTRAMUSCULAR | Status: DC
  Administered 2015-10-25: 2.5 [IU]/h via INTRAVENOUS
  Filled 2015-10-25: qty 4

## 2015-10-25 MED ORDER — SENNOSIDES-DOCUSATE SODIUM 8.6-50 MG PO TABS
2.0000 | ORAL_TABLET | ORAL | Status: DC
  Administered 2015-10-25: 2 via ORAL
  Filled 2015-10-25: qty 2

## 2015-10-25 MED ORDER — FENTANYL 2.5 MCG/ML BUPIVACAINE 1/10 % EPIDURAL INFUSION (WH - ANES)
14.0000 mL/h | INTRAMUSCULAR | Status: DC | PRN
  Administered 2015-10-25 (×2): 14 mL/h via EPIDURAL
  Filled 2015-10-25: qty 125

## 2015-10-25 MED ORDER — DIPHENHYDRAMINE HCL 50 MG/ML IJ SOLN: 12.5000 mg | INTRAMUSCULAR | Status: DC | PRN

## 2015-10-25 MED ORDER — PHENYLEPHRINE 40 MCG/ML (10ML) SYRINGE FOR IV PUSH (FOR BLOOD PRESSURE SUPPORT)
80.0000 ug | PREFILLED_SYRINGE | INTRAVENOUS | Status: DC | PRN
  Filled 2015-10-25: qty 2

## 2015-10-25 MED ORDER — LACTATED RINGERS IV SOLN
INTRAVENOUS | Status: DC
  Administered 2015-10-25 (×2): via INTRAVENOUS

## 2015-10-25 MED ORDER — OXYTOCIN BOLUS FROM INFUSION
500.0000 mL | INTRAVENOUS | Status: DC
  Administered 2015-10-25: 500 mL via INTRAVENOUS

## 2015-10-25 NOTE — MAU Note (Signed)
Contractions started 0430-0500. No leaking or bleeding.

## 2015-10-25 NOTE — H&P (Signed)
Emma Mcdonald is a 30 y.o. female presenting for UC's. Maternal Medical History:  Reason for admission: Contractions.   Contractions: Frequency: regular.    Fetal activity: Perceived fetal activity is normal.   Last perceived fetal movement was within the past hour.    Prenatal complications: no prenatal complications Prenatal Complications - Diabetes: none.    OB History    Gravida Para Term Preterm AB TAB SAB Ectopic Multiple Living   4 2 2  1 1    2      Past Medical History  Diagnosis Date  . Medical history non-contributory    No past surgical history on file. Family History: family history includes Cancer in her mother. There is no history of Other. Social History:  reports that she has never smoked. She has never used smokeless tobacco. She reports that she drinks alcohol. She reports that she does not use illicit drugs.   Prenatal Transfer Tool  Maternal Diabetes: No Genetic Screening: Normal Maternal Ultrasounds/Referrals: Normal Fetal Ultrasounds or other Referrals:  None Maternal Substance Abuse:  No Significant Maternal Medications:  None Significant Maternal Lab Results:  None Other Comments:  None  Review of Systems  All other systems reviewed and are negative.   Dilation: 5.5 Effacement (%): 90 Station: -2 Exam by:: S. Carrera, RNC Blood pressure 121/71, pulse 104, temperature 97.6 F (36.4 C), temperature source Oral, resp. rate 18, height 5\' 4"  (1.626 m), weight 172 lb (78.019 kg), last menstrual period 01/27/2015. Maternal Exam:  Uterine Assessment: Contraction strength is moderate.  Contraction frequency is regular.   Abdomen: Patient reports no abdominal tenderness. Fetal presentation: vertex  Pelvis: adequate for delivery.   Cervix: Cervix evaluated by digital exam.     Physical Exam  Nursing note and vitals reviewed. Constitutional: She is oriented to person, place, and time. She appears well-developed and well-nourished.   HENT:  Head: Normocephalic and atraumatic.  Eyes: Conjunctivae are normal. Pupils are equal, round, and reactive to light.  Neck: Normal range of motion. Neck supple.  Cardiovascular: Normal rate and regular rhythm.   Respiratory: Effort normal and breath sounds normal.  GI: Soft.  Genitourinary: Vagina normal and uterus normal.  Musculoskeletal: Normal range of motion.  Neurological: She is alert and oriented to person, place, and time.  Skin: Skin is warm and dry.  Psychiatric: She has a normal mood and affect. Her behavior is normal. Judgment and thought content normal.    Prenatal labs: ABO, Rh: O/POS/-- (09/29 1120) Antibody: NEG (09/29 1120) Rubella: 0.85 (09/29 1120) RPR: NON REAC (01/26 1019)  HBsAg: NEGATIVE (09/29 1120)  HIV: NONREACTIVE (01/26 1019)  GBS: Negative (03/23 1643)   Assessment/Plan: 38 weeks.  Active labor.  Admit.   Hektor Huston A 10/25/2015, 7:29 AM

## 2015-10-25 NOTE — Anesthesia Postprocedure Evaluation (Signed)
Anesthesia Post Note  Patient: Emma Mcdonald  Procedure(s) Performed: * No procedures listed *  Patient location during evaluation: Mother Baby Anesthesia Type: Epidural Level of consciousness: awake Pain management: pain level controlled Vital Signs Assessment: post-procedure vital signs reviewed and stable Respiratory status: spontaneous breathing Cardiovascular status: stable Postop Assessment: no headache, no backache, epidural receding and patient able to bend at knees Anesthetic complications: no    Last Vitals:  Filed Vitals:   10/25/15 1247 10/25/15 1335  BP: 108/69 123/62  Pulse: 101   Temp: 36.7 C 36.7 C  Resp:  18    Last Pain:  Filed Vitals:   10/25/15 1341  PainSc: 0-No pain                 Rayan Ines

## 2015-10-25 NOTE — Anesthesia Procedure Notes (Signed)
Epidural Patient location during procedure: OB  Staffing Anesthesiologist: Inez Stantz Performed by: anesthesiologist   Preanesthetic Checklist Completed: patient identified, site marked, surgical consent, pre-op evaluation, timeout performed, IV checked, risks and benefits discussed and monitors and equipment checked  Epidural Patient position: sitting Prep: DuraPrep Patient monitoring: heart rate, continuous pulse ox and blood pressure Approach: midline Location: L4-L5 Injection technique: LOR saline  Needle:  Needle type: Tuohy  Needle gauge: 17 G Needle length: 9 cm and 9 Needle insertion depth: 6 cm Catheter type: closed end flexible Catheter size: 20 Guage Catheter at skin depth: 10 cm Test dose: negative  Assessment Events: blood not aspirated, injection not painful, no injection resistance, negative IV test and no paresthesia  Additional Notes Patient identified. Risks/Benefits/Options discussed with patient including but not limited to bleeding, infection, nerve damage, paralysis, failed block, incomplete pain control, headache, blood pressure changes, nausea, vomiting, reactions to medication both or allergic, itching and postpartum back pain. Confirmed with bedside nurse the patient's most recent platelet count. Confirmed with patient that they are not currently taking any anticoagulation, have any bleeding history or any family history of bleeding disorders. Patient expressed understanding and wished to proceed. All questions were answered. Sterile technique was used throughout the entire procedure. Please see nursing notes for vital signs. Test dose was given through epidural needle and negative prior to continuing to dose epidural or start infusion. Warning signs of high block given to the patient including shortness of breath, tingling/numbness in hands, complete motor block, or any concerning symptoms with instructions to call for help. Patient was given  instructions on fall risk and not to get out of bed. All questions and concerns addressed with instructions to call with any issues.   

## 2015-10-25 NOTE — Progress Notes (Signed)
Emma Mcdonald is a 30 y.o. [id number] at 3922w5d by LMP admitted for active labor  Subjective:   Objective: BP 122/61 mmHg  Pulse 102  Temp(Src) 97.5 F (36.4 C) (Axillary)  Resp 20  Ht 5\' 4"  (1.626 m)  Wt 172 lb (78.019 kg)  BMI 29.51 kg/m2  SpO2 100%  LMP 01/27/2015      FHT:  FHR: 120 bpm, variability: moderate,  accelerations:  Present,  decelerations:  Absent UC:   regular, every 2-3 minutes SVE:   Dilation: 10 Effacement (%): 100 Station: +2 Exam by:: Emma SlipperJane Bailey, RN  Labs: Lab Results  Component Value Date   WBC 14.4* 10/25/2015   HGB 10.1* 10/25/2015   HCT 31.4* 10/25/2015   MCV 88.5 10/25/2015   PLT 267 10/25/2015    Assessment / Plan: Spontaneous labor, progressing normally  Labor: Progressing normally Preeclampsia:  n/a Fetal Wellbeing:  Category I Pain Control:  Epidural I/D:  n/a Anticipated MOD:  NSVD  [name],[name] 10/25/2015, 10:56 AM

## 2015-10-25 NOTE — Anesthesia Preprocedure Evaluation (Signed)

## 2015-10-26 ENCOUNTER — Encounter (HOSPITAL_COMMUNITY): Payer: Self-pay | Admitting: *Deleted

## 2015-10-26 LAB — CBC
HCT: 30 % — ABNORMAL LOW (ref 36.0–46.0)
Hemoglobin: 9.6 g/dL — ABNORMAL LOW (ref 12.0–15.0)
MCH: 28.2 pg (ref 26.0–34.0)
MCHC: 32 g/dL (ref 30.0–36.0)
MCV: 88 fL (ref 78.0–100.0)
PLATELETS: 250 10*3/uL (ref 150–400)
RBC: 3.41 MIL/uL — ABNORMAL LOW (ref 3.87–5.11)
RDW: 15.4 % (ref 11.5–15.5)
WBC: 15.8 10*3/uL — AB (ref 4.0–10.5)

## 2015-10-26 MED ORDER — IBUPROFEN 600 MG PO TABS: 600.0000 mg | ORAL_TABLET | Freq: Four times a day (QID) | ORAL | Status: DC | PRN

## 2015-10-26 MED ORDER — MEASLES, MUMPS & RUBELLA VAC ~~LOC~~ INJ
0.5000 mL | INJECTION | Freq: Once | SUBCUTANEOUS | Status: AC
  Administered 2015-10-26: 0.5 mL via SUBCUTANEOUS
  Filled 2015-10-26: qty 0.5

## 2015-10-26 MED ORDER — OXYCODONE-ACETAMINOPHEN 5-325 MG PO TABS: 1.0000 | ORAL_TABLET | ORAL | Status: DC | PRN

## 2015-10-26 NOTE — Progress Notes (Signed)
Post Partum Day 1 Subjective: no complaints  Objective: Blood pressure 112/60, pulse 86, temperature 98.3 F (36.8 C), temperature source Oral, resp. rate 16, height 5\' 4"  (1.626 m), weight 172 lb (78.019 kg), last menstrual period 01/27/2015, SpO2 99 %, unknown if currently breastfeeding.  Physical Exam:  General: alert and no distress Lochia: appropriate Uterine Fundus: firm Incision: none DVT Evaluation: No evidence of DVT seen on physical exam.   Recent Labs  10/25/15 0742  HGB 10.1*  HCT 31.4*    Assessment/Plan: Discharge home   LOS: 1 day   Char Feltman A 10/26/2015, 6:23 AM

## 2015-10-26 NOTE — Progress Notes (Signed)
Dr. Clearance CootsHarper called and notified that the patient did not get the PAS hose initiated after delivery as ordered. Patient for discharge today, negative homan's, ambulating well. He stated she did not need it because she was a low risk.

## 2015-10-26 NOTE — Discharge Summary (Signed)
Obstetric Discharge Summary Reason for Admission: onset of labor Prenatal Procedures: ultrasound Intrapartum Procedures: spontaneous vaginal delivery Postpartum Procedures: none Complications-Operative and Postpartum: none HEMOGLOBIN  Date Value Ref Range Status  10/25/2015 10.1* 12.0 - 15.0 g/dL Final   HCT  Date Value Ref Range Status  10/25/2015 31.4* 36.0 - 46.0 % Final    Physical Exam:  General: alert and no distress Lochia: appropriate Uterine Fundus: firm Incision: none DVT Evaluation: No evidence of DVT seen on physical exam.  Discharge Diagnoses: Term Pregnancy-delivered  Discharge Information: Date: 10/26/2015 Activity: pelvic rest Diet: routine Medications: PNV, Ibuprofen, Colace and Percocet Condition: stable Instructions: refer to practice specific booklet Discharge to: home Follow-up Information    Follow up with [name],[name], MD In 2 weeks.   Specialty:  Obstetrics and Gynecology   Contact information:   [address] [place] [id number] [phone]       Newborn Data: Live born female  Birth Weight: 6 lb 13.5 oz (3105 g) APGAR: 9, 9  Home with mother.  [name],[name] 10/26/2015, 6:28 AM

## 2015-10-29 ENCOUNTER — Encounter: Payer: Medicaid Other | Admitting: Obstetrics

## 2015-10-31 ENCOUNTER — Other Ambulatory Visit: Payer: Self-pay | Admitting: Certified Nurse Midwife

## 2015-11-03 ENCOUNTER — Ambulatory Visit (INDEPENDENT_AMBULATORY_CARE_PROVIDER_SITE_OTHER): Payer: Medicaid Other | Admitting: Obstetrics

## 2015-11-03 ENCOUNTER — Encounter: Payer: Self-pay | Admitting: Obstetrics

## 2015-11-03 DIAGNOSIS — Z30013 Encounter for initial prescription of injectable contraceptive: Secondary | ICD-10-CM

## 2015-11-03 MED ORDER — MEDROXYPROGESTERONE ACETATE 150 MG/ML IM SUSP: 150.0000 mg | INTRAMUSCULAR | Status: DC

## 2015-11-03 NOTE — Progress Notes (Signed)
Subjective:     Emma Mcdonald is a [age] y.o. female who presents for a postpartum visit. She is 1 week postpartum following a spontaneous vaginal delivery. I have fully reviewed the prenatal and intrapartum course. The delivery was at 38 gestational weeks. Outcome: spontaneous vaginal delivery. Anesthesia: epidural. Postpartum course has been normal. Baby's course has been normal. Baby is feeding by bottle - Similac Advance. Bleeding thin lochia. Bowel function is normal. Bladder function is normal. Patient is not sexually active. Contraception method is abstinence. Postpartum depression screening: negative.  Tobacco, alcohol and substance abuse history reviewed.  Adult immunizations reviewed including TDAP, rubella and varicella.  The following portions of the patient's history were reviewed and updated as appropriate: allergies, current medications, past family history, past medical history, past social history, past surgical history and problem list.  Review of Systems A comprehensive review of systems was negative.   Objective:    BP 113/75 mmHg  Pulse 92  Wt 160 lb (72.576 kg)  LMP 01/27/2015    100% of 10 min visit spent on counseling and coordination of care.  Assessment:    1 week postpartum  Contraceptive counseling and advice   Plan:    1. Contraception: Depo-Provera injections 2. Depo Provera Rx 3. Follow up in: 5 weeks or as needed.  Healthy lifestyle practices reviewed

## 2015-12-09 ENCOUNTER — Ambulatory Visit (INDEPENDENT_AMBULATORY_CARE_PROVIDER_SITE_OTHER): Payer: Medicaid Other | Admitting: Obstetrics

## 2015-12-09 ENCOUNTER — Encounter: Payer: Self-pay | Admitting: Obstetrics

## 2015-12-09 DIAGNOSIS — Z3042 Encounter for surveillance of injectable contraceptive: Secondary | ICD-10-CM

## 2015-12-09 DIAGNOSIS — Z30013 Encounter for initial prescription of injectable contraceptive: Secondary | ICD-10-CM

## 2015-12-09 MED ORDER — MEDROXYPROGESTERONE ACETATE 150 MG/ML IM SUSP
150.0000 mg | Freq: Once | INTRAMUSCULAR | Status: AC
  Administered 2015-12-09: 150 mg via INTRAMUSCULAR

## 2015-12-09 NOTE — Progress Notes (Signed)
Subjective:     Emma Mcdonald is a 30 y.o. female who presents for a postpartum visit. She is 8 weeks postpartum following a spontaneous vaginal delivery. I have fully reviewed the prenatal and intrapartum course. The delivery was at 38.5 gestational weeks. Outcome: spontaneous vaginal delivery. Anesthesia: epidural. Postpartum course has been normal. Baby's course has been normal. Baby is feeding by bottle - Similac Advance. Bleeding moderate lochia. Bowel function is normal. Bladder function is normal. Patient is sexually active. Contraception method is none. Postpartum depression screening: negative.  Tobacco, alcohol and substance abuse history reviewed.  Adult immunizations reviewed including TDAP, rubella and varicella.  The following portions of the patient's history were reviewed and updated as appropriate: allergies, current medications, past family history, past medical history, past social history, past surgical history and problem list.  Review of Systems A comprehensive review of systems was negative.   Objective:    BP 99/70 mmHg  Pulse 80  Wt 148 lb (67.132 kg)  General:  alert and no distress   Breasts:  inspection negative, no nipple discharge or bleeding, no masses or nodularity palpable  Lungs: clear to auscultation bilaterally  Heart:  regular rate and rhythm, S1, S2 normal, no murmur, click, rub or gallop  Abdomen: normal findings: soft, non-tender   Vulva:  normal  Vagina: normal vagina  Cervix:  no cervical motion tenderness  Corpus: normal size, contour, position, consistency, mobility, non-tender  Adnexa:  no mass, fullness, tenderness  Rectal Exam: Not performed.           Assessment:     Normal postpartum exam. Pap smear not done at today's visit.     Contraceptive counseling and advice.  Wants Depo Provera.  Plan:    1. Contraception: Depo-Provera injections 2. Depo Provera injection given today. 3. Follow up in: 6 weeks or as needed.    Healthy lifestyle practices reviewed

## 2016-01-15 ENCOUNTER — Ambulatory Visit: Payer: Medicaid Other | Admitting: Obstetrics

## 2016-01-26 ENCOUNTER — Ambulatory Visit: Payer: Medicaid Other | Admitting: Obstetrics

## 2016-03-02 ENCOUNTER — Ambulatory Visit: Payer: Medicaid Other

## 2016-03-02 VITALS — BP 116/83 | HR 92

## 2016-03-02 DIAGNOSIS — Z3042 Encounter for surveillance of injectable contraceptive: Secondary | ICD-10-CM

## 2016-03-02 IMAGING — US US OB COMP LESS 14 WK
1 series · 15 of 26 positions shown · non-contrast
Comparison: None.

CLINICAL DATA: Unsure last menstrual period.  Dating.

EXAM:
OBSTETRIC <14 WK ULTRASOUND
TECHNIQUE: Transabdominal ultrasound was performed for evaluation of the
gestation as well as the maternal uterus and adnexal regions.

[Series 1: us ob comp less 14 wk · 15 of 26 slices shown]
[im 1/26]
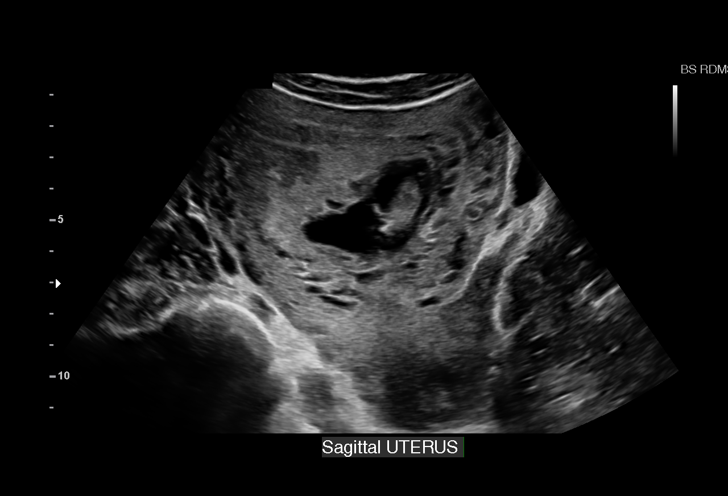
[im 3/26]
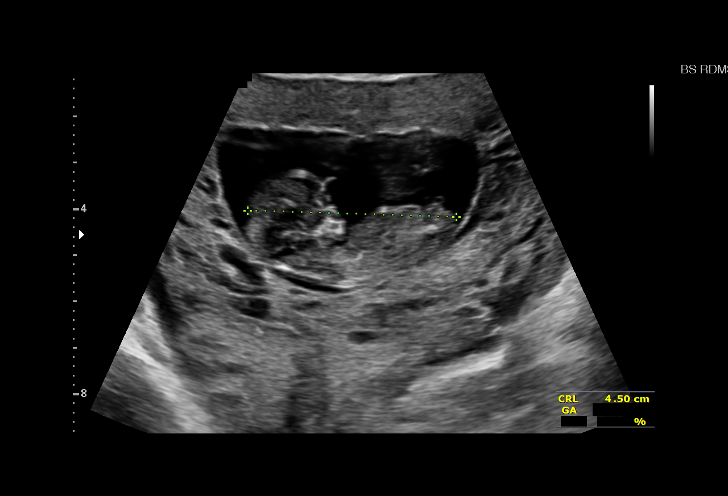
[im 5/26]
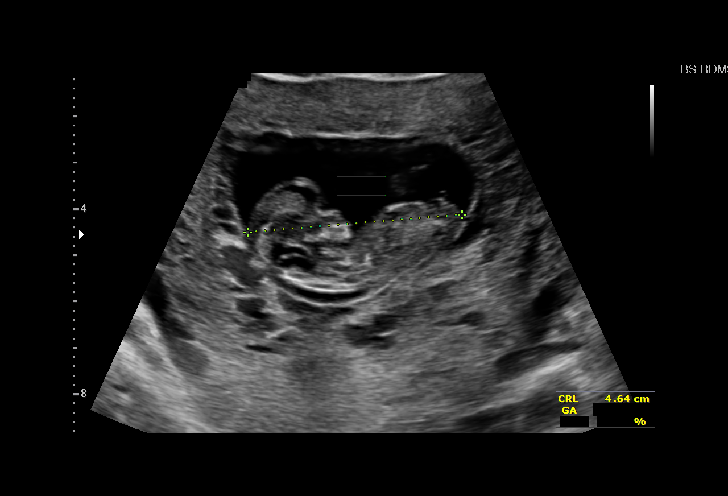
[im 7/26]
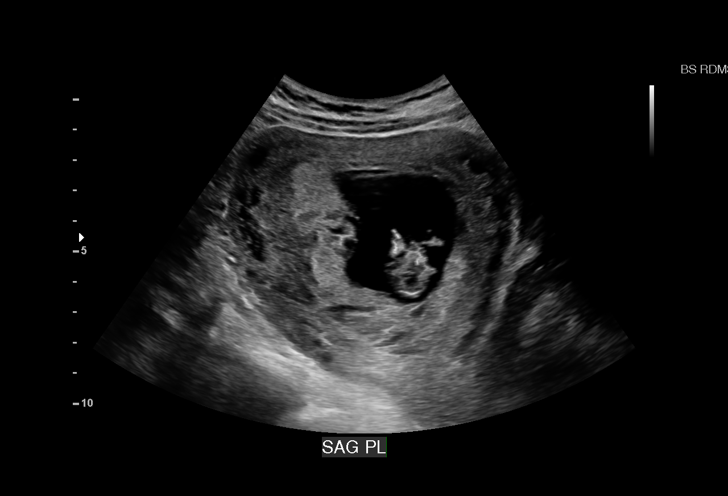
[im 8/26]
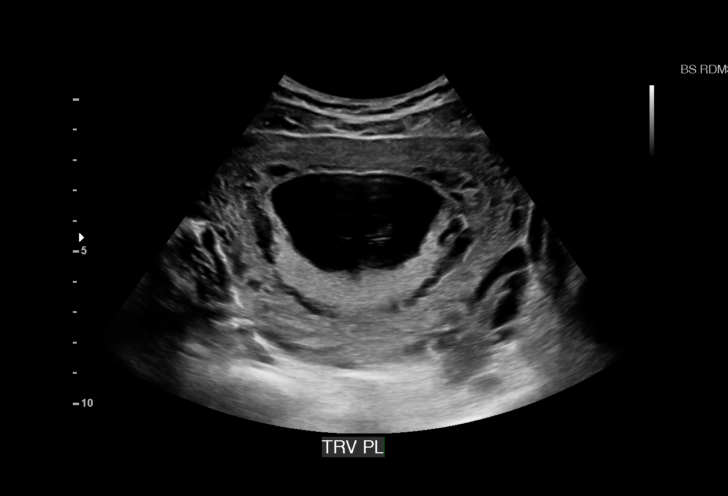
[im 10/26]
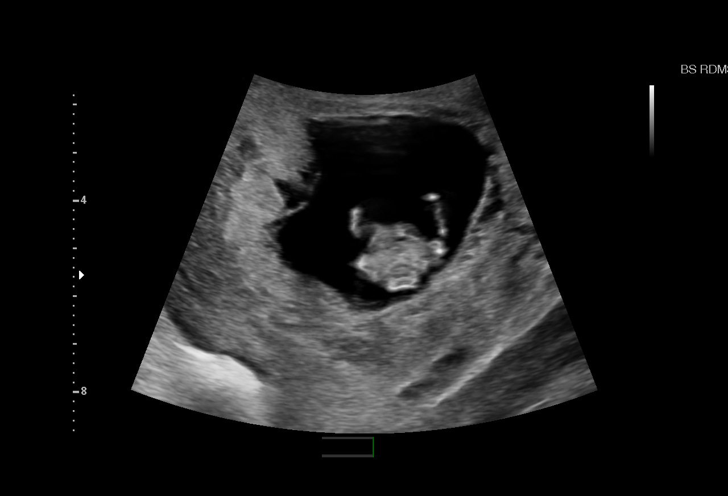
[im 12/26]
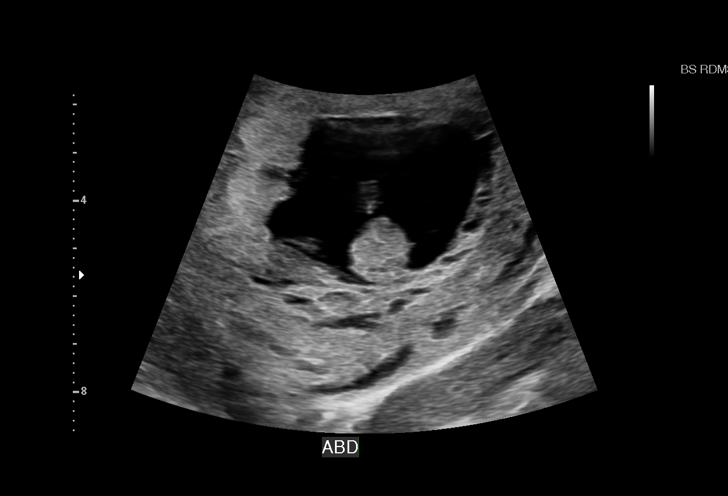
[im 14/26]
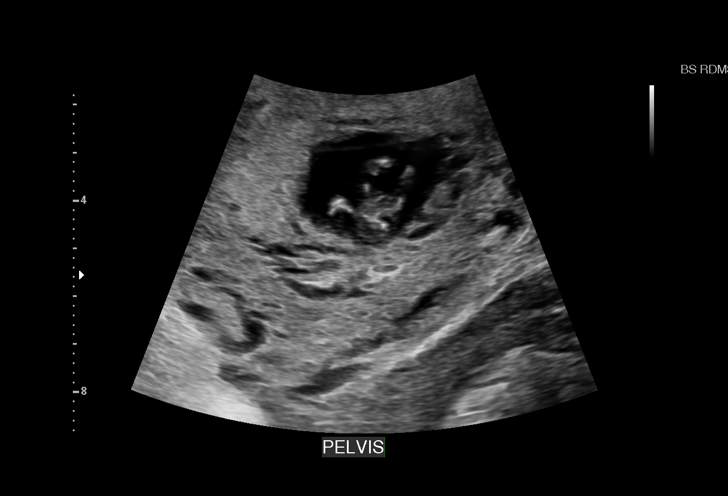
[im 15/26]
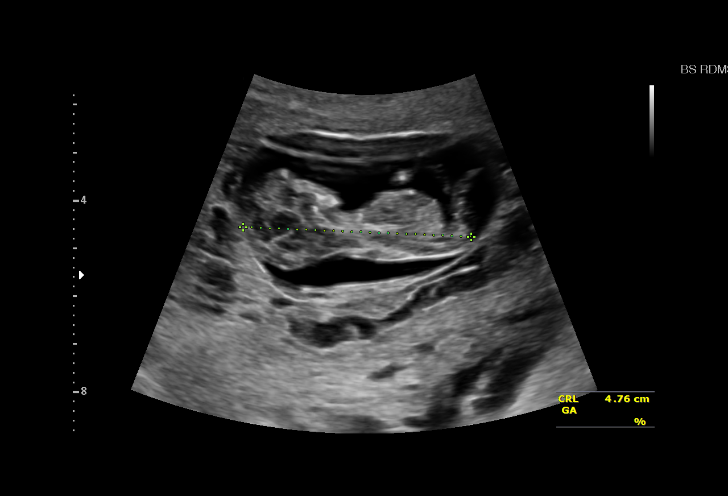
[im 17/26]
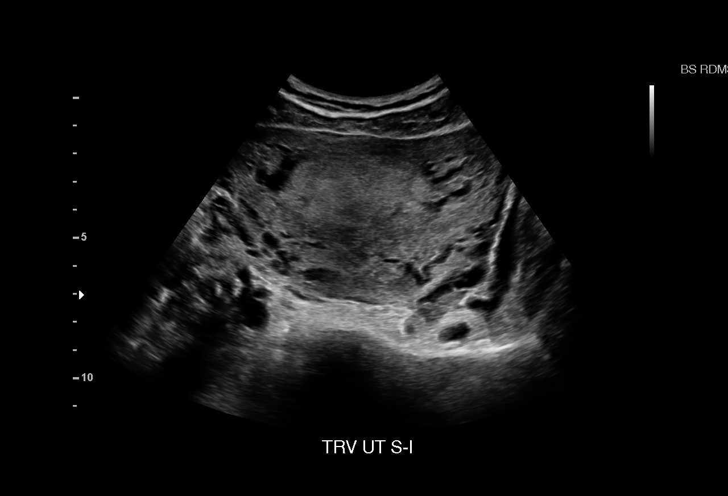
[im 19/26]
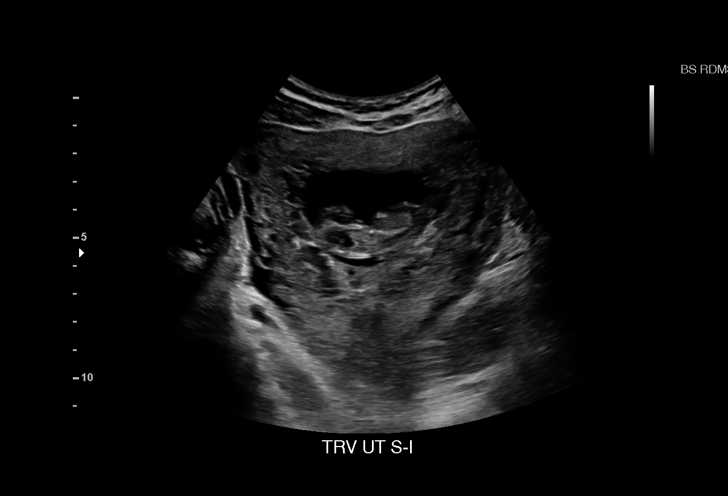
[im 20/26]
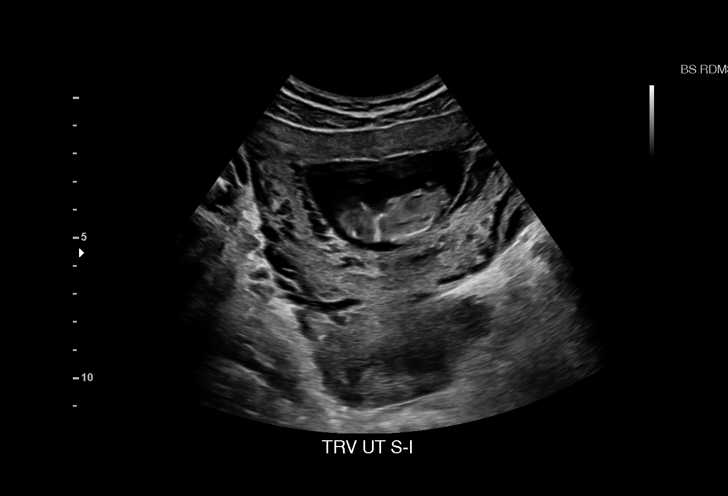
[im 22/26]
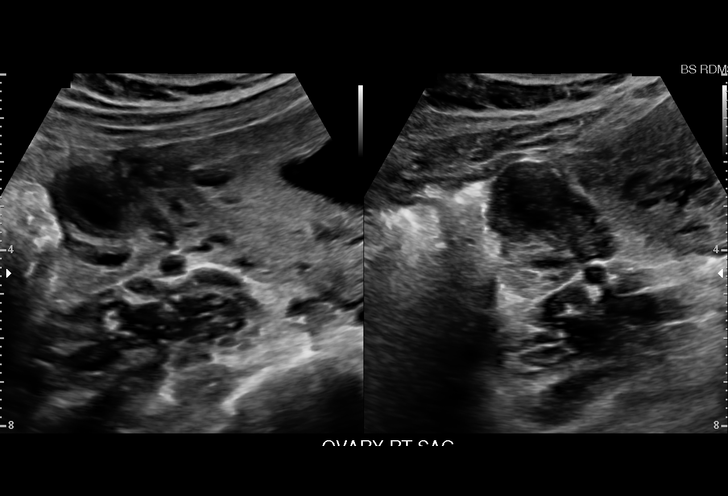
[im 24/26]
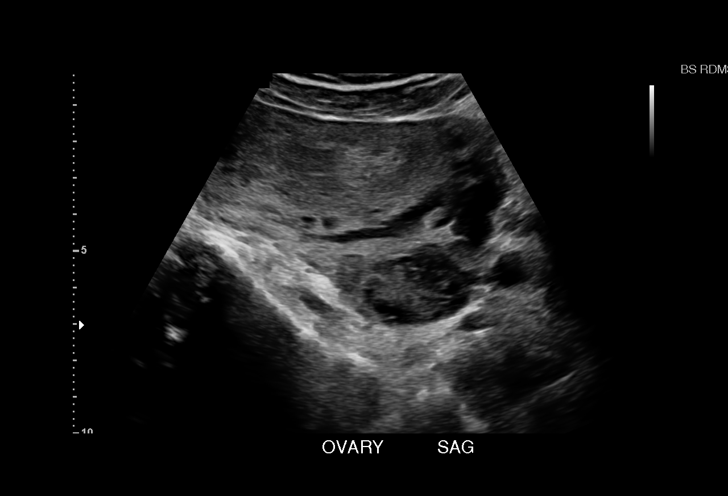
[im 26/26]
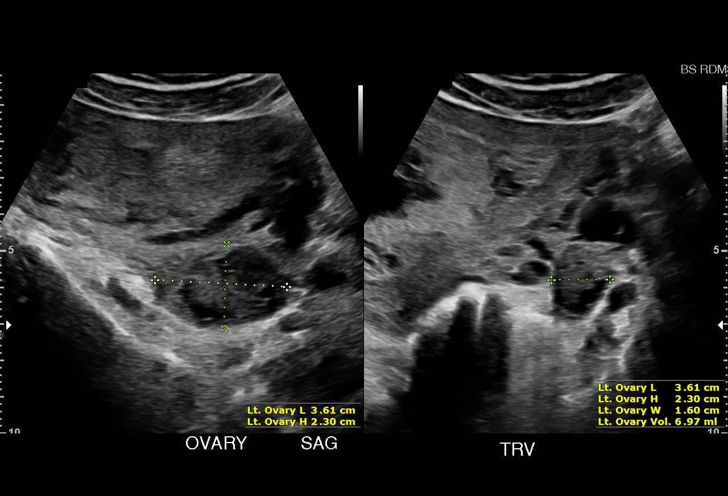

[15 of 26 positions shown; findings below may reference images not displayed]

FINDINGS: Intrauterine gestational sac: Visualized/normal in shape.

Yolk sac:  Visualized

Embryo:  Visualized

Cardiac Activity: Visualized

Heart Rate: 171 bpm

MSD:   mm    w     d

CRL:   46.2  mm   11 w 3 d                  US EDC: 11/02/2015

Maternal uterus/adnexae: No subchorionic hemorrhage. No adnexal
masses or free fluid.
IMPRESSION: Eleven week 3 day intrauterine pregnancy. Fetal heart rate 171 beats
per minute. No acute maternal findings.

## 2016-03-02 MED ORDER — MEDROXYPROGESTERONE ACETATE 150 MG/ML IM SUSP
150.0000 mg | Freq: Once | INTRAMUSCULAR | Status: AC
  Administered 2016-03-02: 150 mg via INTRAMUSCULAR

## 2016-03-02 NOTE — Progress Notes (Unsigned)
Pt is in office today for depo injection. Pt is on time for her injection.  Pt was given depo, tolerated well.  Pt has no other concerns today.  Administrations This Visit    medroxyPROGESTERone (DEPO-PROVERA) injection 150 mg    Admin Date 03/02/2016 Action Given Dose 150 mg Route Intramuscular Administered By Lanney GinsSuzanne D Noam Karaffa, CMA

## 2016-05-24 ENCOUNTER — Ambulatory Visit: Payer: Self-pay

## 2016-08-26 IMAGING — US US MFM OB FOLLOW-UP
1 series · 14 of 28 positions shown · non-contrast
Comparison: none

[Series 1: us mfm ob follow-up · 45 acquisitions, 14 frames shown]
[im 2/45]
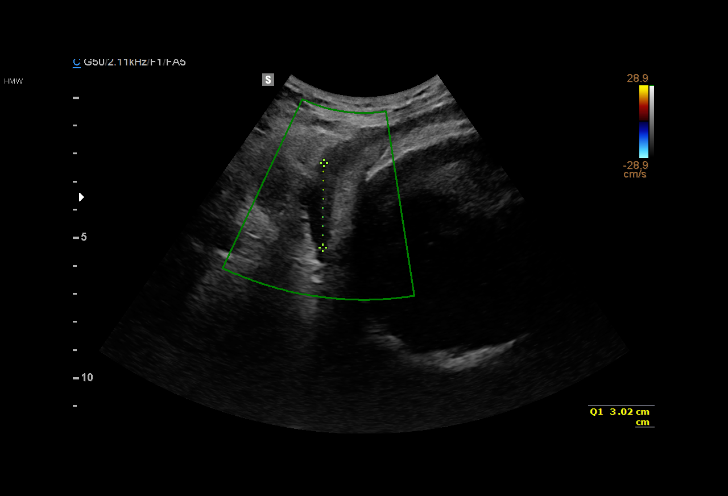
[im 5/45]
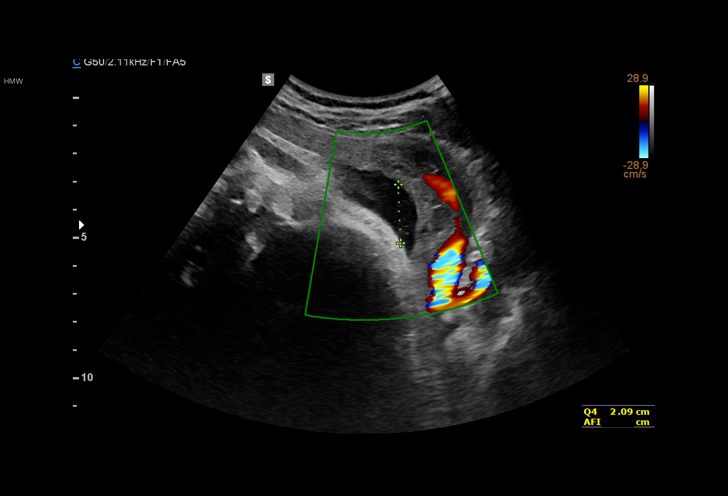
[im 9/45]
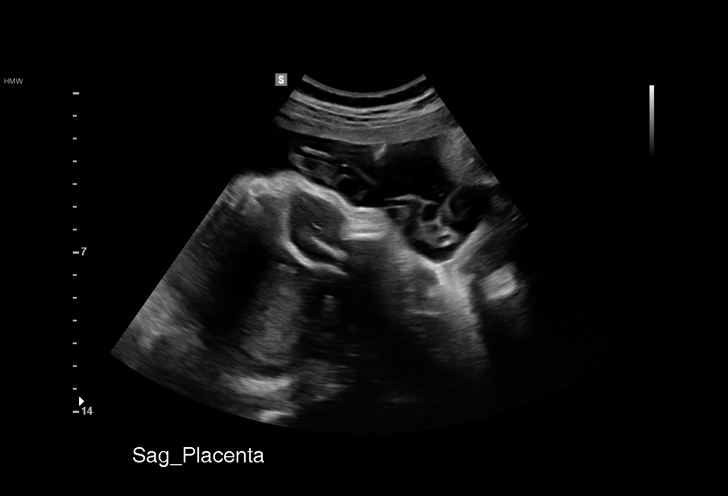
[im 12/45]
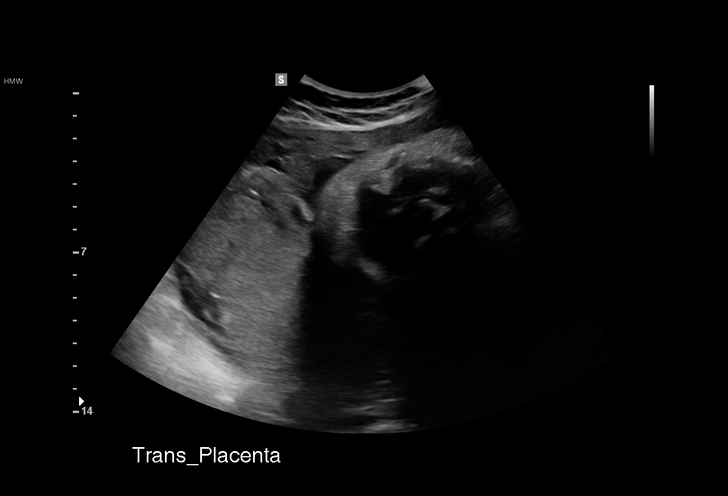
[im 15/45]
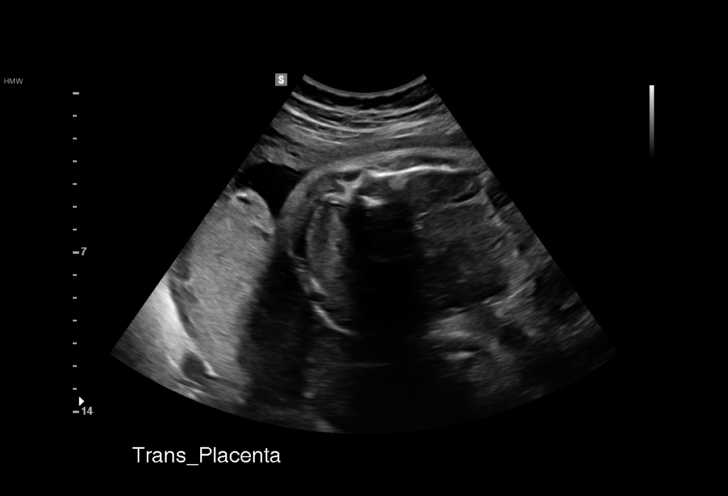
[im 18/45]
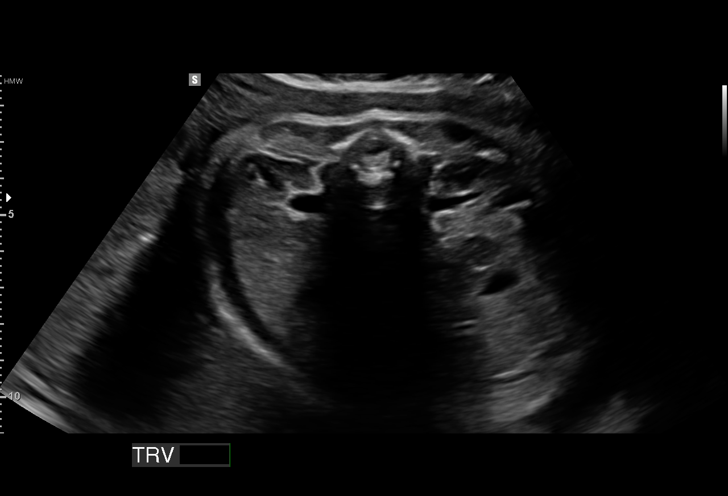
[im 22/45]
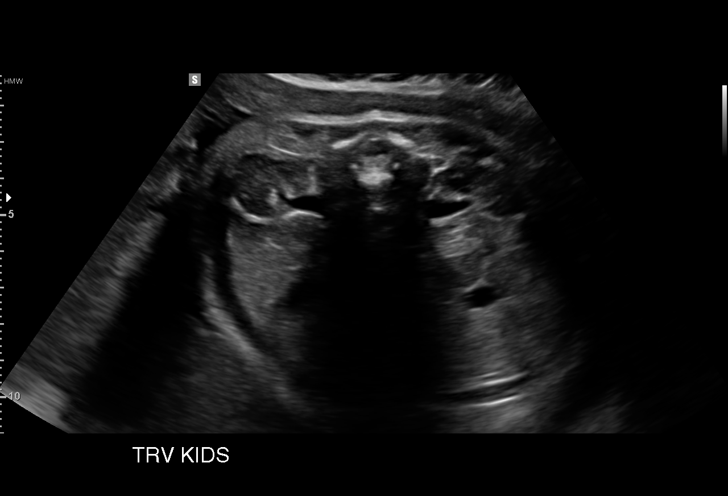
[im 25/45]
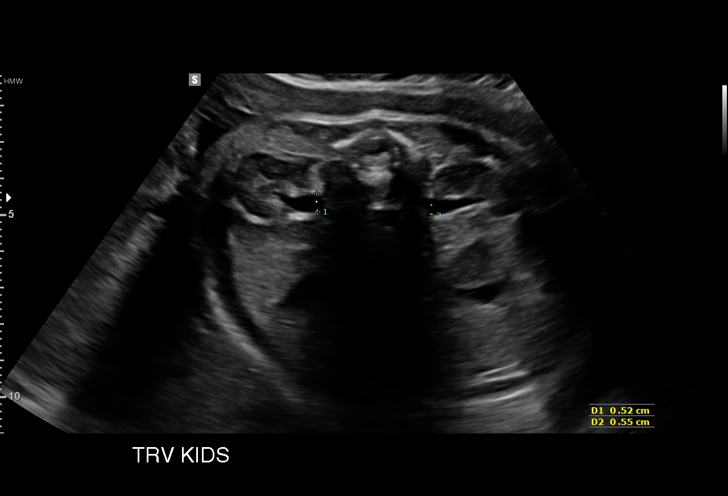
[im 28/45]
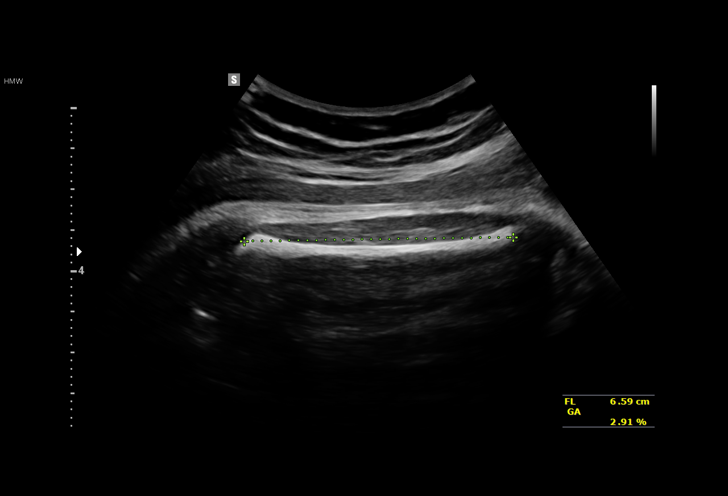
[im 31/45]
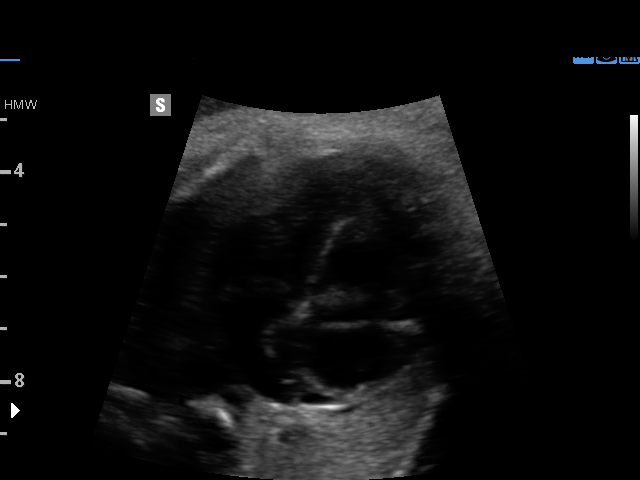
[im 35/45]
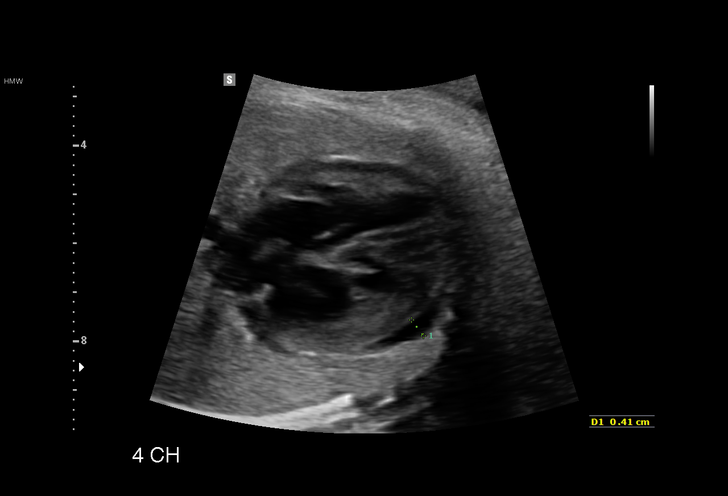
[im 38/45]
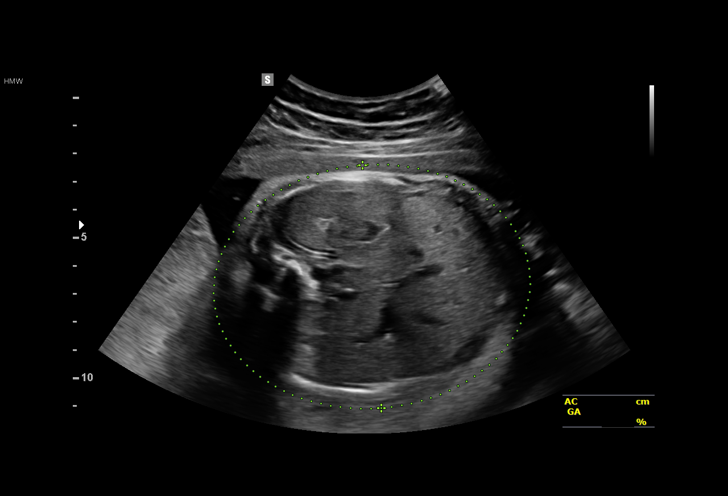
[im 41/45]
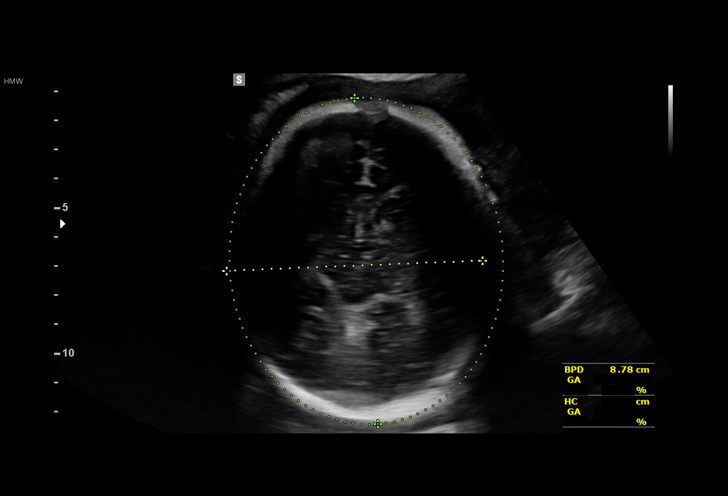
[im 45/45]
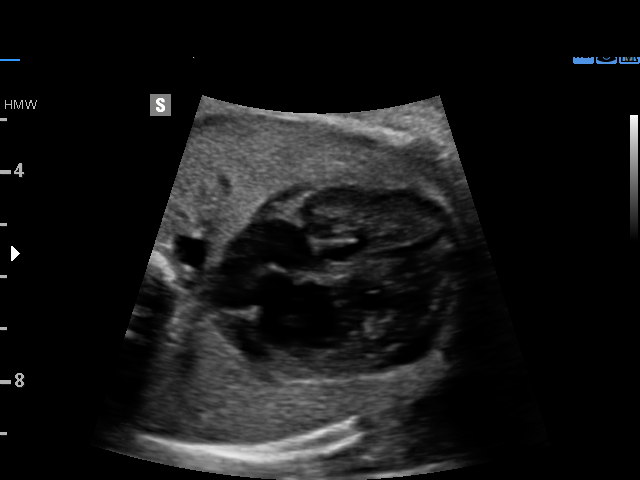

[14 of 28 positions shown; findings below may reference images not displayed]

PADAM

1  TAIWO SHAO           [id number]      [id number]     [id number]
Indications

36 weeks gestation of pregnancy
Fetal abnormality - other known or
suspected (UTD ); low risk quad screen
OB History

Gravidity:    4         Term:   2
TOP:          1        Living:  2
Fetal Evaluation

Num Of Fetuses:     1
Fetal Heart         137
Rate(bpm):
Cardiac Activity:   Observed
Presentation:       Cephalic
Placenta:           Posterior, above cervical os
P. Cord Insertion:  Visualized

Amniotic Fluid
AFI FV:      Subjectively within normal limits
AFI Sum:     11.39   cm      33   %Tile     Larg Pckt:   3.38   cm
RUQ:   3.02   cm    RLQ:    2.09   cm    LUQ:   2.9     cm   LLQ:    3.38   cm
Biometry

BPD:      85.3  mm     G. Age:  34w 2d                  CI:        71.36   %   70 - 86
FL/HC:      20.6   %   20.8 -
HC:      321.6  mm     G. Age:  36w 2d         18  %    HC/AC:      1.03       0.92 -
AC:      312.1  mm     G. Age:  35w 1d         23  %    FL/BPD:     77.8   %   71 - 87
FL:       66.4  mm     G. Age:  34w 1d          5  %    FL/AC:      21.3   %   20 - 24
HUM:      58.3  mm     G. Age:  33w 5d         17  %
Est. FW:    7669  gm    5 lb 10 oz      27  %
Gestational Age

LMP:           36w 4d       Date:   01/27/15                 EDD:   11/03/15
U/S Today:     35w 0d                                        EDD:   11/14/15
Best:          36w 4d    Det. By:   LMP  (01/27/15)          EDD:   11/03/15
Anatomy

Cranium:          Appears normal         Aortic Arch:      Previously seen
Fetal Cavum:      Previously seen        Ductal Arch:      Previously seen
Ventricles:       Appears normal         Diaphragm:        Previously seen
Choroid Plexus:   Previously seen        Stomach:          Appears normal, left
sided
Cerebellum:       Previously seen        Abdomen:          Previously seen
Posterior Fossa:  Previously seen        Abdominal Wall:   Previously seen
Nuchal Fold:      Previously seen        Cord Vessels:     Previously seen
Face:             Orbits and profile     Kidneys:          Appear normal
previously seen
Lips:             Previously seen        Bladder:          Appears normal
Fetal Thoracic:   Previously seen        Spine:            Previously seen
Heart:            Appears normal         Upper             Previously seen
(4CH, axis, and situs  Extremities:
RVOT:             Previously seen        Lower             Previously seen
Extremities:
LVOT:             Previously seen

Other:  Heels and 5th digit prev seen. Fetus appears to be a male.
Cervix Uterus Adnexa

Cervix
Not visualized (advanced GA >28wks)

Uterus
No abnormality visualized.

Left Ovary
Not visualized. No adnexal mass visualized.

Right Ovary
Not visualized. No adnexal mass visualized.

Cul De Sac:   No free fluid seen.

Adnexa:       No abnormality visualized.
Impression

SIUP at 36+4 weeks
Normal interval anatomy; anatomic survey complete; kidneys
appeared normal today
Normal amniotic fluid volume
Appropriate interval growth with EFW at the 27th %tile
Recommendations

Follow-up as clinically indicated

## 2016-08-31 ENCOUNTER — Ambulatory Visit: Payer: 59 | Admitting: Obstetrics

## 2017-07-12 NOTE — L&D Delivery Note (Addendum)
Patient: Emma Mcdonald MRN: [id number]  GBS status: negative, IAP given NA  Patient is a 32 y.o. now [id number] s/p NSVD at 3584w1d, who was admitted for SOL. SROM 2h 2321m prior to delivery with clear fluid.    Delivery Note At 1:27 PM a viable female was delivered via Vaginal, Spontaneous (Presentation:vertex; ROA).  APGAR: 9, 9; weight pending.   Placenta status:delivered spontaneously ,intact .  Cord: 3-vessel with the following complications: none.  Cord pH: pending  Anesthesia:  epidural Episiotomy: None Lacerations: None Suture Repair: none Est. Blood Loss (mL): 300  Mom to postpartum.  Baby to Couplet care / Skin to Skin.  Emma Mcdonald 06/26/2018, 1:54 PM   Head delivered ROA. No nuchal cord present. Shoulder and body delivered in usual fashion. Infant with spontaneous cry, placed on mother's abdomen, dried and bulb suctioned. Cord clamped x 2 after 1-minute delay, and cut by family member. Cord blood drawn. Placenta delivered spontaneously with gentle cord traction. Fundus firm with massage and Pitocin. Perineum inspected and found to have no lacerations.  Midwife attestation: I was gloved and present for delivery in its entirety and I agree with the above resident's note.  Emma Mcdonald, CNM 5:24 PM

## 2018-02-27 ENCOUNTER — Ambulatory Visit (INDEPENDENT_AMBULATORY_CARE_PROVIDER_SITE_OTHER): Payer: Self-pay | Admitting: Obstetrics

## 2018-02-27 ENCOUNTER — Encounter: Payer: Self-pay | Admitting: Obstetrics

## 2018-02-27 VITALS — BP 107/73 | HR 92 | Wt 169.4 lb

## 2018-02-27 DIAGNOSIS — Z3480 Encounter for supervision of other normal pregnancy, unspecified trimester: Secondary | ICD-10-CM

## 2018-02-27 DIAGNOSIS — Z1151 Encounter for screening for human papillomavirus (HPV): Secondary | ICD-10-CM

## 2018-02-27 DIAGNOSIS — Z113 Encounter for screening for infections with a predominantly sexual mode of transmission: Secondary | ICD-10-CM

## 2018-02-27 DIAGNOSIS — Z124 Encounter for screening for malignant neoplasm of cervix: Secondary | ICD-10-CM

## 2018-02-27 DIAGNOSIS — Z3482 Encounter for supervision of other normal pregnancy, second trimester: Secondary | ICD-10-CM

## 2018-02-27 MED ORDER — PRENATAL PLUS 27-1 MG PO TABS: 1.0000 | ORAL_TABLET | Freq: Every day | ORAL | 11 refills | Status: DC

## 2018-02-27 NOTE — Addendum Note (Signed)
Addended by: Coral CeoHARPER, Skyelar Halliday A on: 02/27/2018 02:25 PM   Modules accepted: Orders

## 2018-02-27 NOTE — Progress Notes (Signed)
Subjective:    Emma Mcdonald is being seen today for her first obstetrical visit.  This is a planned pregnancy. She is at 7246w1d gestation. Her obstetrical history is significant for none. Relationship with FOB: spouse, living together. Patient does intend to breast feed. Pregnancy history fully reviewed.  The information documented in the HPI was reviewed and verified.  Menstrual History: OB History    Gravida  5   Para  3   Term  3   Preterm  0   AB  1   Living  3     SAB  0   TAB  1   Ectopic  0   Multiple  0   Live Births  3            Patient's last menstrual period was 09/25/2017.    Past Medical History:  Diagnosis Date  . Medical history non-contributory     History reviewed. No pertinent surgical history.   (Not in a hospital admission) No Known Allergies  Social History   Tobacco Use  . Smoking status: Former Smoker    Last attempt to quit: 10/2017    Years since quitting: 0.3  . Smokeless tobacco: Never Used  Substance Use Topics  . Alcohol use: Yes    Alcohol/week: 1.0 standard drinks    Types: 1 Glasses of wine per week    Comment: none during pregnancy    Family History  Problem Relation Age of Onset  . Breast cancer Mother   . Other Neg Hx      Review of Systems Constitutional: negative for weight loss Gastrointestinal: negative for vomiting Genitourinary:negative for genital lesions and vaginal discharge and dysuria Musculoskeletal:negative for back pain Behavioral/Psych: negative for abusive relationship, depression, illegal drug usage and tobacco use    Objective:    BP 107/73   Pulse 92   Wt 169 lb 6.4 oz (76.8 kg)   LMP 09/25/2017   BMI 29.08 kg/m  General Appearance:    Alert, cooperative, no distress, appears stated age  Head:    Normocephalic, without obvious abnormality, atraumatic  Eyes:    PERRL, conjunctiva/corneas clear, EOM's intact, fundi    benign, both eyes  Ears:    Normal TM's and external  ear canals, both ears  Nose:   Nares normal, septum midline, mucosa normal, no drainage    or sinus tenderness  Throat:   Lips, mucosa, and tongue normal; teeth and gums normal  Neck:   Supple, symmetrical, trachea midline, no adenopathy;    thyroid:  no enlargement/tenderness/nodules; no carotid   bruit or JVD  Back:     Symmetric, no curvature, ROM normal, no CVA tenderness  Lungs:     Clear to auscultation bilaterally, respirations unlabored  Chest Wall:    No tenderness or deformity   Heart:    Regular rate and rhythm, S1 and S2 normal, no murmur, rub   or gallop  Breast Exam:    No tenderness, masses, or nipple abnormality  Abdomen:     Soft, non-tender, bowel sounds active all four quadrants,    no masses, no organomegaly  Genitalia:    Normal female without lesion, discharge or tenderness  Extremities:   Extremities normal, atraumatic, no cyanosis or edema  Pulses:   2+ and symmetric all extremities  Skin:   Skin color, texture, turgor normal, no rashes or lesions  Lymph nodes:   Cervical, supraclavicular, and axillary nodes normal  Neurologic:   CNII-XII intact, normal  strength, sensation and reflexes    throughout      Lab Review Urine pregnancy test Labs reviewed yes Radiologic studies reviewed no   Assessment:    Pregnancy at 3136w1d weeks    Plan:     1. Supervision of other normal pregnancy, antepartum Rx: - Cytology - PAP - Cervicovaginal ancillary only - Culture, OB Urine - Hemoglobinopathy evaluation - Genetic Screening - Enroll Patient in Bab[employer]s - prenatal vitamin w/FE, FA (PRENATAL 1 + 1) 27-1 MG TABS tablet; Take 1 tablet by mouth daily before breakfast.  Dispense: 30 each; Refill: 11  Prenatal vitamins.  Counseling provided regarding continued use of seat belts, cessation of alcohol consumption, smoking or use of illicit drugs; infection precautions i.e., influenza/TDAP immunizations, toxoplasmosis,CMV, parvovirus, listeria and varicella; workplace  safety, exercise during pregnancy; routine dental care, safe medications, sexual activity, hot tubs, saunas, pools, travel, caffeine use, fish and methlymercury, potential toxins, hair treatments, varicose veins Weight gain recommendations per IOM guidelines reviewed: underweight/BMI< 18.5--> gain 28 - 40 lbs; normal weight/BMI 18.5 - 24.9--> gain 25 - 35 lbs; overweight/BMI 25 - 29.9--> gain 15 - 25 lbs; obese/BMI >30->gain  11 - 20 lbs Problem list reviewed and updated. FIRST/CF mutation testing/NIPT/QUAD SCREEN/fragile X/Ashkenazi Jewish population testing/Spinal muscular atrophy discussed: requested. Role of ultrasound in pregnancy discussed; fetal survey: requested. Amniocentesis discussed: not indicated.   Meds ordered this encounter  Medications  . prenatal vitamin w/FE, FA (PRENATAL 1 + 1) 27-1 MG TABS tablet    Sig: Take 1 tablet by mouth daily before breakfast.    Dispense:  30 each    Refill:  11   Orders Placed This Encounter  Procedures  . Culture, OB Urine  . Hemoglobinopathy evaluation  . Genetic Screening    Panorama    Follow up in 4 weeks. 50% of 20 min visit spent on counseling and coordination of care.     Brock BadHARLES A. Malania Gawthrop MD 02-27-2018

## 2018-02-27 NOTE — Addendum Note (Signed)
Addended by: Natale MilchSTALLING, Gary Gabrielsen D on: 02/27/2018 02:35 PM   Modules accepted: Orders

## 2018-02-27 NOTE — Progress Notes (Signed)
Patient is in the office for initial ob visit. Pt is married, unplanned pregnancy, pt did not know that she was pregnant until recently. Pt reports good fetal movement with occasional pressure.

## 2018-02-28 LAB — OBSTETRIC PANEL, INCLUDING HIV
Antibody Screen: NEGATIVE
BASOS: 0 %
Basophils Absolute: 0.1 10*3/uL (ref 0.0–0.2)
EOS (ABSOLUTE): 0.1 10*3/uL (ref 0.0–0.4)
Eos: 1 %
HEP B S AG: NEGATIVE
HIV Screen 4th Generation wRfx: NONREACTIVE
Hematocrit: 35.9 % (ref 34.0–46.6)
Hemoglobin: 12.2 g/dL (ref 11.1–15.9)
IMMATURE GRANS (ABS): 0.1 10*3/uL (ref 0.0–0.1)
IMMATURE GRANULOCYTES: 1 %
LYMPHS ABS: 1.9 10*3/uL (ref 0.7–3.1)
Lymphs: 17 %
MCH: 32 pg (ref 26.6–33.0)
MCHC: 34 g/dL (ref 31.5–35.7)
MCV: 94 fL (ref 79–97)
Monocytes Absolute: 0.6 10*3/uL (ref 0.1–0.9)
Monocytes: 5 %
NEUTROS PCT: 76 %
Neutrophils Absolute: 8.8 10*3/uL — ABNORMAL HIGH (ref 1.4–7.0)
Platelets: 318 10*3/uL (ref 150–450)
RBC: 3.81 x10E6/uL (ref 3.77–5.28)
RDW: 11.8 % — ABNORMAL LOW (ref 12.3–15.4)
RH TYPE: POSITIVE
RPR: NONREACTIVE
Rubella Antibodies, IGG: 1.59 index (ref >0.99)
WBC: 11.6 10*3/uL — AB (ref 3.4–10.8)

## 2018-02-28 LAB — CERVICOVAGINAL ANCILLARY ONLY
Bacterial vaginitis: NEGATIVE
CHLAMYDIA, DNA PROBE: NEGATIVE
Candida vaginitis: NEGATIVE
Neisseria Gonorrhea: NEGATIVE
Trichomonas: NEGATIVE

## 2018-03-01 LAB — CYTOLOGY - PAP
Diagnosis: NEGATIVE
HPV: NOT DETECTED

## 2018-03-02 LAB — HEMOGLOBINOPATHY EVALUATION
HEMOGLOBIN A2 QUANTITATION: 2 % (ref 1.8–3.2)
HEMOGLOBIN F QUANTITATION: 0 % (ref 0.0–2.0)
HGB A: 98 % (ref 96.4–98.8)
HGB C: 0 %
HGB S: 0 %
HGB VARIANT: 0 %

## 2018-03-02 LAB — URINE CULTURE, OB REFLEX

## 2018-03-02 LAB — CULTURE, OB URINE

## 2018-03-28 ENCOUNTER — Encounter: Payer: Self-pay | Admitting: Obstetrics

## 2018-03-28 ENCOUNTER — Other Ambulatory Visit: Payer: Self-pay

## 2018-03-28 ENCOUNTER — Ambulatory Visit (INDEPENDENT_AMBULATORY_CARE_PROVIDER_SITE_OTHER): Payer: Self-pay | Admitting: Obstetrics

## 2018-03-28 DIAGNOSIS — Z3480 Encounter for supervision of other normal pregnancy, unspecified trimester: Secondary | ICD-10-CM

## 2018-03-28 DIAGNOSIS — Z3482 Encounter for supervision of other normal pregnancy, second trimester: Secondary | ICD-10-CM

## 2018-03-28 NOTE — Progress Notes (Signed)
ROB.  Patient is [employer], letter given to take to the [facility] for FLU & TDAP Vaccines.

## 2018-03-28 NOTE — Progress Notes (Signed)
Subjective:  Emma Mcdonald is a 32 y.o. [id number] at 4114w2d being seen today for ongoing prenatal care.  She is currently monitored for the following issues for this low-risk pregnancy and has Normal delivery; Active labor at term; NSVD (normal spontaneous vaginal delivery); and Supervision of other normal pregnancy, antepartum on their problem list.  Patient reports no complaints.  Contractions: Not present. Vag. Bleeding: None.  Movement: Present. Denies leaking of fluid.   The following portions of the patient's history were reviewed and updated as appropriate: allergies, current medications, past family history, past medical history, past social history, past surgical history and problem list. Problem list updated.  Objective:   Vitals:   03/28/18 1006  BP: 101/66  Pulse: 98  Weight: 174 lb (78.9 kg)    Fetal Status: Fetal Heart Rate (bpm): 150   Movement: Present     General:  Alert, oriented and cooperative. Patient is in no acute distress.  Skin: Skin is warm and dry. No rash noted.   Cardiovascular: Normal heart rate noted  Respiratory: Normal respiratory effort, no problems with respiration noted  Abdomen: Soft, gravid, appropriate for gestational age. Pain/Pressure: Present     Pelvic:  Cervical exam deferred        Extremities: Normal range of motion.  Edema: None  Mental Status: Normal mood and affect. Normal behavior. Normal judgment and thought content.   Urinalysis:      Assessment and Plan:  Pregnancy: [id number] at 4814w2d  1. Supervision of other normal pregnancy, antepartum   Preterm labor symptoms and general obstetric precautions including but not limited to vaginal bleeding, contractions, leaking of fluid and fetal movement were reviewed in detail with the patient. Please refer to After Visit Summary for other counseling recommendations.  Return in about 2 weeks (around 04/11/2018) for ROB, 2 hour OGTT.   Brock BadHarper, Toyia Jelinek A, MD

## 2018-04-12 ENCOUNTER — Ambulatory Visit (INDEPENDENT_AMBULATORY_CARE_PROVIDER_SITE_OTHER): Payer: Self-pay | Admitting: Obstetrics

## 2018-04-12 ENCOUNTER — Encounter: Payer: Self-pay | Admitting: Obstetrics

## 2018-04-12 ENCOUNTER — Encounter: Payer: Self-pay | Admitting: General Practice

## 2018-04-12 VITALS — BP 112/67 | HR 105 | Wt 177.0 lb

## 2018-04-12 DIAGNOSIS — Z3480 Encounter for supervision of other normal pregnancy, unspecified trimester: Secondary | ICD-10-CM

## 2018-04-12 NOTE — Progress Notes (Signed)
t received Tdap already has vaccination records to be scanned to her chart.

## 2018-04-12 NOTE — Progress Notes (Signed)
Subjective:  Emma Mcdonald is a 32 y.o. [id number] at [redacted]w[redacted]d being seen today for ongoing prenatal care.  She is currently monitored for the following issues for this low-risk pregnancy and has Normal delivery; Active labor at term; NSVD (normal spontaneous vaginal delivery); and Supervision of other normal pregnancy, antepartum on their problem list.  Patient reports no complaints.  Contractions: Not present. Vag. Bleeding: None.  Movement: Present. Denies leaking of fluid.   The following portions of the patient's history were reviewed and updated as appropriate: allergies, current medications, past family history, past medical history, past social history, past surgical history and problem list. Problem list updated.  Objective:   Vitals:   04/12/18 0935  BP: 112/67  Pulse: (!) 105  Weight: 177 lb (80.3 kg)    Fetal Status: Fetal Heart Rate (bpm): 150   Movement: Present     General:  Alert, oriented and cooperative. Patient is in no acute distress.  Skin: Skin is warm and dry. No rash noted.   Cardiovascular: Normal heart rate noted  Respiratory: Normal respiratory effort, no problems with respiration noted  Abdomen: Soft, gravid, appropriate for gestational age. Pain/Pressure: Absent     Pelvic:  Cervical exam deferred        Extremities: Normal range of motion.  Edema: None  Mental Status: Normal mood and affect. Normal behavior. Normal judgment and thought content.   Urinalysis:      Assessment and Plan:  Pregnancy: [id number] at [redacted]w[redacted]d  1. Supervision of other normal pregnancy, antepartum Rx: - Glucose Tolerance, 2 Hours w/1 Hour - CBC - RPR - HIV Antibody (routine testing w rflx)  Preterm labor symptoms and general obstetric precautions including but not limited to vaginal bleeding, contractions, leaking of fluid and fetal movement were reviewed in detail with the patient. Please refer to After Visit Summary for other counseling recommendations.  Return in about 2  weeks (around 04/26/2018) for ROB.   Brock Bad, MD

## 2018-04-13 LAB — GLUCOSE TOLERANCE, 2 HOURS W/ 1HR
GLUCOSE, 1 HOUR: 113 mg/dL (ref 65–179)
GLUCOSE, 2 HOUR: 76 mg/dL (ref 65–152)
Glucose, Fasting: 74 mg/dL (ref 65–91)

## 2018-04-13 LAB — CBC
HEMATOCRIT: 35.4 % (ref 34.0–46.6)
HEMOGLOBIN: 11.8 g/dL (ref 11.1–15.9)
MCH: 31.1 pg (ref 26.6–33.0)
MCHC: 33.3 g/dL (ref 31.5–35.7)
MCV: 93 fL (ref 79–97)
Platelets: 295 10*3/uL (ref 150–450)
RBC: 3.8 x10E6/uL (ref 3.77–5.28)
RDW: 12.3 % (ref 12.3–15.4)
WBC: 12.8 10*3/uL — ABNORMAL HIGH (ref 3.4–10.8)

## 2018-04-13 LAB — HIV ANTIBODY (ROUTINE TESTING W REFLEX): HIV SCREEN 4TH GENERATION: NONREACTIVE

## 2018-04-13 LAB — RPR: RPR: NONREACTIVE

## 2018-04-26 ENCOUNTER — Ambulatory Visit (INDEPENDENT_AMBULATORY_CARE_PROVIDER_SITE_OTHER): Payer: Self-pay | Admitting: Obstetrics

## 2018-04-26 ENCOUNTER — Encounter: Payer: Self-pay | Admitting: Obstetrics

## 2018-04-26 VITALS — BP 105/67 | HR 96 | Wt 178.0 lb

## 2018-04-26 DIAGNOSIS — Z3480 Encounter for supervision of other normal pregnancy, unspecified trimester: Secondary | ICD-10-CM

## 2018-04-26 DIAGNOSIS — Z3483 Encounter for supervision of other normal pregnancy, third trimester: Secondary | ICD-10-CM

## 2018-04-26 NOTE — Progress Notes (Signed)
Subjective:  Emma Mcdonald is a 32 y.o. [id number] at [redacted]w[redacted]d being seen today for ongoing prenatal care.  She is currently monitored for the following issues for this low-risk pregnancy and has Normal delivery; Active labor at term; NSVD (normal spontaneous vaginal delivery); and Supervision of other normal pregnancy, antepartum on their problem list.  Patient reports no complaints.  Contractions: Not present. Vag. Bleeding: None.  Movement: Present. Denies leaking of fluid.   The following portions of the patient's history were reviewed and updated as appropriate: allergies, current medications, past family history, past medical history, past social history, past surgical history and problem list. Problem list updated.  Objective:   Vitals:   04/26/18 0955  BP: 105/67  Pulse: 96  Weight: 178 lb (80.7 kg)    Fetal Status:     Movement: Present     General:  Alert, oriented and cooperative. Patient is in no acute distress.  Skin: Skin is warm and dry. No rash noted.   Cardiovascular: Normal heart rate noted  Respiratory: Normal respiratory effort, no problems with respiration noted  Abdomen: Soft, gravid, appropriate for gestational age. Pain/Pressure: Absent     Pelvic:  Cervical exam deferred        Extremities: Normal range of motion.  Edema: Trace  Mental Status: Normal mood and affect. Normal behavior. Normal judgment and thought content.   Urinalysis:      Assessment and Plan:  Pregnancy: [id number] at [redacted]w[redacted]d  1. Supervision of other normal pregnancy, antepartum   Preterm labor symptoms and general obstetric precautions including but not limited to vaginal bleeding, contractions, leaking of fluid and fetal movement were reviewed in detail with the patient. Please refer to After Visit Summary for other counseling recommendations.  Return in about 2 weeks (around 05/10/2018) for ROB.   Brock Bad, MD

## 2018-04-26 NOTE — Progress Notes (Signed)
Pt presents for a ROB. Pt has no complaints 

## 2018-05-10 ENCOUNTER — Encounter: Payer: Self-pay | Admitting: Obstetrics

## 2018-05-10 ENCOUNTER — Ambulatory Visit (INDEPENDENT_AMBULATORY_CARE_PROVIDER_SITE_OTHER): Payer: Self-pay | Admitting: Obstetrics

## 2018-05-10 DIAGNOSIS — Z3480 Encounter for supervision of other normal pregnancy, unspecified trimester: Secondary | ICD-10-CM

## 2018-05-10 DIAGNOSIS — Z3483 Encounter for supervision of other normal pregnancy, third trimester: Secondary | ICD-10-CM

## 2018-05-10 NOTE — Progress Notes (Signed)
Subjective:  Emma Mcdonald Patient is a 32 y.o. [id number] at [redacted]w[redacted]d being seen today for ongoing prenatal care.  She is currently monitored for the following issues for this low-risk pregnancy and has Normal delivery; Active labor at term; NSVD (normal spontaneous vaginal delivery); and Supervision of other normal pregnancy, antepartum on their problem list.  Patient reports no complaints.  Contractions: Not present. Vag. Bleeding: None.  Movement: Present. Denies leaking of fluid.   The following portions of the patient's history were reviewed and updated as appropriate: allergies, current medications, past family history, past medical history, past social history, past surgical history and problem list. Problem list updated.  Objective:   Vitals:   05/10/18 0908  BP: 98/62  Pulse: 90  Weight: 180 lb 3.2 oz (81.7 kg)    Fetal Status: Fetal Heart Rate (bpm): 150   Movement: Present     General:  Alert, oriented and cooperative. Patient is in no acute distress.  Skin: Skin is warm and dry. No rash noted.   Cardiovascular: Normal heart rate noted  Respiratory: Normal respiratory effort, no problems with respiration noted  Abdomen: Soft, gravid, appropriate for gestational age. Pain/Pressure: Present     Pelvic:  Cervical exam deferred        Extremities: Normal range of motion.  Edema: Trace  Mental Status: Normal mood and affect. Normal behavior. Normal judgment and thought content.   Urinalysis:      Assessment and Plan:  Pregnancy: [id number] at [redacted]w[redacted]d  1. Supervision of other normal pregnancy, antepartum   Preterm labor symptoms and general obstetric precautions including but not limited to vaginal bleeding, contractions, leaking of fluid and fetal movement were reviewed in detail with the patient. Please refer to After Visit Summary for other counseling recommendations.  Return in about 2 weeks (around 05/24/2018) for ROB.   Brock Bad, MD

## 2018-05-10 NOTE — Progress Notes (Signed)
Patient reports good fetal movement with occasional pressure. 

## 2018-05-24 ENCOUNTER — Encounter: Payer: Self-pay | Admitting: Obstetrics

## 2018-05-24 ENCOUNTER — Other Ambulatory Visit: Payer: Self-pay

## 2018-05-24 ENCOUNTER — Ambulatory Visit (INDEPENDENT_AMBULATORY_CARE_PROVIDER_SITE_OTHER): Payer: Self-pay | Admitting: Obstetrics

## 2018-05-24 VITALS — BP 109/65 | HR 96 | Wt 184.0 lb

## 2018-05-24 DIAGNOSIS — Z3483 Encounter for supervision of other normal pregnancy, third trimester: Secondary | ICD-10-CM

## 2018-05-24 DIAGNOSIS — Z3480 Encounter for supervision of other normal pregnancy, unspecified trimester: Secondary | ICD-10-CM

## 2018-05-24 MED ORDER — COMFORT FIT MATERNITY SUPP SM MISC: 0 refills | Status: DC

## 2018-05-24 NOTE — Progress Notes (Signed)
Subjective:  Emma Mcdonald is a 32 y.o. [id number] at 3236w3d being seen today for ongoing prenatal care.  She is currently monitored for the following issues for this low-risk pregnancy and has Normal delivery; Active labor at term; NSVD (normal spontaneous vaginal delivery); and Supervision of other normal pregnancy, antepartum on their problem list.  Patient reports no complaints.  Contractions: Irritability. Vag. Bleeding: None.  Movement: Present. Denies leaking of fluid.   The following portions of the patient's history were reviewed and updated as appropriate: allergies, current medications, past family history, past medical history, past social history, past surgical history and problem list. Problem list updated.  Objective:   Vitals:   05/24/18 1006  BP: 109/65  Pulse: 96  Weight: 184 lb (83.5 kg)    Fetal Status: Fetal Heart Rate (bpm): 150   Movement: Present     General:  Alert, oriented and cooperative. Patient is in no acute distress.  Skin: Skin is warm and dry. No rash noted.   Cardiovascular: Normal heart rate noted  Respiratory: Normal respiratory effort, no problems with respiration noted  Abdomen: Soft, gravid, appropriate for gestational age. Pain/Pressure: Present     Pelvic:  Cervical exam deferred        Extremities: Normal range of motion.  Edema: Trace  Mental Status: Normal mood and affect. Normal behavior. Normal judgment and thought content.   Urinalysis:      Assessment and Plan:  Pregnancy: [id number] at 1036w3d  1. Supervision of other normal pregnancy, antepartum   Preterm labor symptoms and general obstetric precautions including but not limited to vaginal bleeding, contractions, leaking of fluid and fetal movement were reviewed in detail with the patient. Please refer to After Visit Summary for other counseling recommendations.  Return in about 2 weeks (around 06/07/2018) for ROB.   Brock BadHarper, Brayley Mackowiak A, MD

## 2018-05-24 NOTE — Progress Notes (Signed)
ROB.  C/o having a cold with cough.  Denies fever, chills, NV. [facility].

## 2018-05-24 NOTE — Addendum Note (Signed)
Addended by: Coral CeoHARPER, [name] on: 05/24/2018 10:41 AM   Modules accepted: Orders

## 2018-06-05 ENCOUNTER — Other Ambulatory Visit (HOSPITAL_COMMUNITY)
Admission: RE | Admit: 2018-06-05 | Discharge: 2018-06-05 | Disposition: A | Discharge status: Home / Self Care | Payer: Medicaid Other | Source: Ambulatory Visit | Attending: Obstetrics | Admitting: Obstetrics

## 2018-06-05 ENCOUNTER — Encounter: Payer: Self-pay | Admitting: Obstetrics

## 2018-06-05 ENCOUNTER — Ambulatory Visit (INDEPENDENT_AMBULATORY_CARE_PROVIDER_SITE_OTHER): Payer: Self-pay | Admitting: Obstetrics

## 2018-06-05 VITALS — BP 119/74 | HR 100 | Wt 185.3 lb

## 2018-06-05 DIAGNOSIS — Z3480 Encounter for supervision of other normal pregnancy, unspecified trimester: Secondary | ICD-10-CM

## 2018-06-05 DIAGNOSIS — Z3483 Encounter for supervision of other normal pregnancy, third trimester: Secondary | ICD-10-CM

## 2018-06-05 NOTE — Progress Notes (Signed)
Pt presents for ROB GBS/GC/CT due today  

## 2018-06-05 NOTE — Progress Notes (Signed)
Subjective:  Emma Mcdonald is a 32 y.o. [id number] at 3969w1d being seen today for ongoing prenatal care.  She is currently monitored for the following issues for this low-risk pregnancy and has Normal delivery; Active labor at term; NSVD (normal spontaneous vaginal delivery); and Supervision of other normal pregnancy, antepartum on their problem list.  Patient reports no complaints.  Contractions: Irregular. Vag. Bleeding: None.  Movement: Present. Denies leaking of fluid.   The following portions of the patient's history were reviewed and updated as appropriate: allergies, current medications, past family history, past medical history, past social history, past surgical history and problem list. Problem list updated.  Objective:   Vitals:   06/05/18 0938  BP: 119/74  Pulse: 100  Weight: 185 lb 4.8 oz (84.1 kg)    Fetal Status: Fetal Heart Rate (bpm): 150   Movement: Present     General:  Alert, oriented and cooperative. Patient is in no acute distress.  Skin: Skin is warm and dry. No rash noted.   Cardiovascular: Normal heart rate noted  Respiratory: Normal respiratory effort, no problems with respiration noted  Abdomen: Soft, gravid, appropriate for gestational age. Pain/Pressure: Present     Pelvic:  Cervical exam deferred        Extremities: Normal range of motion.  Edema: Trace  Mental Status: Normal mood and affect. Normal behavior. Normal judgment and thought content.   Urinalysis:      Assessment and Plan:  Pregnancy: [id number] at 6469w1d  1. Supervision of other normal pregnancy, antepartum Rx: - Strep Gp B NAA - Cervicovaginal ancillary only  Preterm labor symptoms and general obstetric precautions including but not limited to vaginal bleeding, contractions, leaking of fluid and fetal movement were reviewed in detail with the patient. Please refer to After Visit Summary for other counseling recommendations.  Return in about 1 week (around 06/12/2018) for  ROB.   Brock BadHarper, [name], MD

## 2018-06-06 LAB — CERVICOVAGINAL ANCILLARY ONLY
Bacterial vaginitis: NEGATIVE
Candida vaginitis: NEGATIVE
Chlamydia: NEGATIVE
Neisseria Gonorrhea: NEGATIVE
TRICH (WINDOWPATH): NEGATIVE

## 2018-06-07 LAB — STREP GP B NAA: Strep Gp B NAA: NEGATIVE

## 2018-06-14 ENCOUNTER — Ambulatory Visit (INDEPENDENT_AMBULATORY_CARE_PROVIDER_SITE_OTHER): Payer: Self-pay | Admitting: Obstetrics

## 2018-06-14 ENCOUNTER — Encounter: Payer: Self-pay | Admitting: Obstetrics

## 2018-06-14 DIAGNOSIS — Z3480 Encounter for supervision of other normal pregnancy, unspecified trimester: Secondary | ICD-10-CM

## 2018-06-14 DIAGNOSIS — Z3483 Encounter for supervision of other normal pregnancy, third trimester: Secondary | ICD-10-CM

## 2018-06-14 NOTE — Progress Notes (Signed)
Subjective:  Emma Mcdonald is a 32 y.o. [id number] at 5954w3d being seen today for ongoing prenatal care.  She is currently monitored for the following issues for this low-risk pregnancy and has Normal delivery; Active labor at term; NSVD (normal spontaneous vaginal delivery); and Supervision of other normal pregnancy, antepartum on their problem list.  Patient reports occasional contractions.  Contractions: Irregular. Vag. Bleeding: None.  Movement: Present. Denies leaking of fluid.   The following portions of the patient's history were reviewed and updated as appropriate: allergies, current medications, past family history, past medical history, past social history, past surgical history and problem list. Problem list updated.  Objective:   Vitals:   06/14/18 0924  BP: 101/68  Pulse: 90  Weight: 184 lb (83.5 kg)    Fetal Status:     Movement: Present     General:  Alert, oriented and cooperative. Patient is in no acute distress.  Skin: Skin is warm and dry. No rash noted.   Cardiovascular: Normal heart rate noted  Respiratory: Normal respiratory effort, no problems with respiration noted  Abdomen: Soft, gravid, appropriate for gestational age. Pain/Pressure: Present     Pelvic:  Cervical exam deferred        Extremities: Normal range of motion.  Edema: Trace  Mental Status: Normal mood and affect. Normal behavior. Normal judgment and thought content.   Urinalysis:      Assessment and Plan:  Pregnancy: [id number] at 6754w3d  1. Supervision of other normal pregnancy, antepartum   Term labor symptoms and general obstetric precautions including but not limited to vaginal bleeding, contractions, leaking of fluid and fetal movement were reviewed in detail with the patient. Please refer to After Visit Summary for other counseling recommendations.  Return in about 1 week (around 06/21/2018) for ROB.   Brock BadHarper, [name], MD

## 2018-06-14 NOTE — Progress Notes (Signed)
GBS: Negative GC/CT: NEG

## 2018-06-23 ENCOUNTER — Ambulatory Visit (INDEPENDENT_AMBULATORY_CARE_PROVIDER_SITE_OTHER): Payer: Self-pay | Admitting: Obstetrics

## 2018-06-23 ENCOUNTER — Other Ambulatory Visit: Payer: Self-pay

## 2018-06-23 ENCOUNTER — Encounter: Payer: Self-pay | Admitting: Obstetrics

## 2018-06-23 VITALS — BP 103/66 | HR 92 | Wt 184.9 lb

## 2018-06-23 DIAGNOSIS — Z3483 Encounter for supervision of other normal pregnancy, third trimester: Secondary | ICD-10-CM

## 2018-06-23 DIAGNOSIS — Z3480 Encounter for supervision of other normal pregnancy, unspecified trimester: Secondary | ICD-10-CM

## 2018-06-23 NOTE — Progress Notes (Signed)
Subjective:  Emma Mcdonald is a 32 y.o. [id number] at 2254w5d being seen today for ongoing prenatal care.  She is currently monitored for the following issues for this low-risk pregnancy and has Normal delivery; Active labor at term; NSVD (normal spontaneous vaginal delivery); and Supervision of other normal pregnancy, antepartum on their problem list.  Patient reports no complaints.  Contractions: Irritability. Vag. Bleeding: None.  Movement: Present. Denies leaking of fluid.   The following portions of the patient's history were reviewed and updated as appropriate: allergies, current medications, past family history, past medical history, past social history, past surgical history and problem list. Problem list updated.  Objective:   Vitals:   06/23/18 0849  BP: 103/66  Pulse: 92  Weight: 184 lb 14.4 oz (83.9 kg)    Fetal Status:     Movement: Present     General:  Alert, oriented and cooperative. Patient is in no acute distress.  Skin: Skin is warm and dry. No rash noted.   Cardiovascular: Normal heart rate noted  Respiratory: Normal respiratory effort, no problems with respiration noted  Abdomen: Soft, gravid, appropriate for gestational age. Pain/Pressure: Present     Pelvic:  Cvx:  2cm /50% / -3 / Vtx  Extremities: Normal range of motion.  Edema: Trace  Mental Status: Normal mood and affect. Normal behavior. Normal judgment and thought content.   Urinalysis:      Assessment and Plan:  Pregnancy: [id number] at 3654w5d  1. Supervision of other normal pregnancy, antepartum   Term labor symptoms and general obstetric precautions including but not limited to vaginal bleeding, contractions, leaking of fluid and fetal movement were reviewed in detail with the patient. Please refer to After Visit Summary for other counseling recommendations.  No follow-ups on file.   Brock BadHarper, [name], MD

## 2018-06-26 ENCOUNTER — Inpatient Hospital Stay (HOSPITAL_COMMUNITY): Payer: Medicaid Other | Admitting: Anesthesiology

## 2018-06-26 ENCOUNTER — Inpatient Hospital Stay (HOSPITAL_COMMUNITY)
Admission: AD | Admit: 2018-06-26 | Discharge: 2018-06-27 | DRG: 807 | Disposition: A | Discharge status: Home / Self Care | Payer: Medicaid Other | Attending: Obstetrics and Gynecology | Admitting: Obstetrics and Gynecology

## 2018-06-26 ENCOUNTER — Encounter (HOSPITAL_COMMUNITY): Payer: Self-pay | Admitting: *Deleted

## 2018-06-26 DIAGNOSIS — Z87891 Personal history of nicotine dependence: Secondary | ICD-10-CM | POA: Diagnosis not present

## 2018-06-26 DIAGNOSIS — Z3483 Encounter for supervision of other normal pregnancy, third trimester: Secondary | ICD-10-CM | POA: Diagnosis present

## 2018-06-26 DIAGNOSIS — Z3A39 39 weeks gestation of pregnancy: Secondary | ICD-10-CM

## 2018-06-26 DIAGNOSIS — Z23 Encounter for immunization: Secondary | ICD-10-CM

## 2018-06-26 LAB — CBC
HCT: 38.4 % (ref 36.0–46.0)
Hemoglobin: 12.3 g/dL (ref 12.0–15.0)
MCH: 29.7 pg (ref 26.0–34.0)
MCHC: 32 g/dL (ref 30.0–36.0)
MCV: 92.8 fL (ref 80.0–100.0)
PLATELETS: 296 10*3/uL (ref 150–400)
RBC: 4.14 MIL/uL (ref 3.87–5.11)
RDW: 15 % (ref 11.5–15.5)
WBC: 9.3 10*3/uL (ref 4.0–10.5)
nRBC: 0 % (ref 0.0–0.2)

## 2018-06-26 LAB — TYPE AND SCREEN
ABO/RH(D): O POS
Antibody Screen: NEGATIVE

## 2018-06-26 LAB — RPR: RPR Ser Ql: NONREACTIVE

## 2018-06-26 MED ORDER — LIDOCAINE HCL (PF) 1 % IJ SOLN
INTRAMUSCULAR | Status: DC | PRN
  Administered 2018-06-26 (×2): 5 mL via EPIDURAL

## 2018-06-26 MED ORDER — OXYTOCIN 40 UNITS IN LACTATED RINGERS INFUSION - SIMPLE MED
2.5000 [IU]/h | INTRAVENOUS | Status: DC
  Filled 2018-06-26: qty 1000

## 2018-06-26 MED ORDER — DIPHENHYDRAMINE HCL 50 MG/ML IJ SOLN: 12.5000 mg | INTRAMUSCULAR | Status: DC | PRN

## 2018-06-26 MED ORDER — INFLUENZA VAC SPLIT QUAD 0.5 ML IM SUSY
0.5000 mL | PREFILLED_SYRINGE | INTRAMUSCULAR | Status: AC
  Administered 2018-06-27: 0.5 mL via INTRAMUSCULAR

## 2018-06-26 MED ORDER — FENTANYL 2.5 MCG/ML BUPIVACAINE 1/10 % EPIDURAL INFUSION (WH - ANES)
14.0000 mL/h | INTRAMUSCULAR | Status: DC | PRN
  Administered 2018-06-26: 14 mL/h via EPIDURAL
  Filled 2018-06-26: qty 100

## 2018-06-26 MED ORDER — IBUPROFEN 600 MG PO TABS
600.0000 mg | ORAL_TABLET | Freq: Four times a day (QID) | ORAL | Status: DC
  Administered 2018-06-26 – 2018-06-27 (×4): 600 mg via ORAL
  Filled 2018-06-26 (×4): qty 1

## 2018-06-26 MED ORDER — EPHEDRINE 5 MG/ML INJ
10.0000 mg | INTRAVENOUS | Status: DC | PRN
  Filled 2018-06-26: qty 2

## 2018-06-26 MED ORDER — ACETAMINOPHEN 325 MG PO TABS: 650.0000 mg | ORAL_TABLET | ORAL | Status: DC | PRN

## 2018-06-26 MED ORDER — ONDANSETRON HCL 4 MG/2ML IJ SOLN: 4.0000 mg | Freq: Four times a day (QID) | INTRAMUSCULAR | Status: DC | PRN

## 2018-06-26 MED ORDER — ONDANSETRON HCL 4 MG/2ML IJ SOLN: 4.0000 mg | INTRAMUSCULAR | Status: DC | PRN

## 2018-06-26 MED ORDER — PHENYLEPHRINE 40 MCG/ML (10ML) SYRINGE FOR IV PUSH (FOR BLOOD PRESSURE SUPPORT)
80.0000 ug | PREFILLED_SYRINGE | INTRAVENOUS | Status: DC | PRN
  Filled 2018-06-26 (×2): qty 10

## 2018-06-26 MED ORDER — OXYCODONE-ACETAMINOPHEN 5-325 MG PO TABS: 2.0000 | ORAL_TABLET | ORAL | Status: DC | PRN

## 2018-06-26 MED ORDER — OXYTOCIN BOLUS FROM INFUSION
500.0000 mL | Freq: Once | INTRAVENOUS | Status: DC
  Administered 2018-06-26: 500 mL via INTRAVENOUS

## 2018-06-26 MED ORDER — FLEET ENEMA 7-19 GM/118ML RE ENEM: 1.0000 | ENEMA | RECTAL | Status: DC | PRN

## 2018-06-26 MED ORDER — SIMETHICONE 80 MG PO CHEW: 80.0000 mg | CHEWABLE_TABLET | ORAL | Status: DC | PRN

## 2018-06-26 MED ORDER — ONDANSETRON HCL 4 MG PO TABS: 4.0000 mg | ORAL_TABLET | ORAL | Status: DC | PRN

## 2018-06-26 MED ORDER — WITCH HAZEL-GLYCERIN EX PADS: 1.0000 "application " | MEDICATED_PAD | CUTANEOUS | Status: DC | PRN

## 2018-06-26 MED ORDER — BENZOCAINE-MENTHOL 20-0.5 % EX AERO: 1.0000 "application " | INHALATION_SPRAY | CUTANEOUS | Status: DC | PRN

## 2018-06-26 MED ORDER — SOD CITRATE-CITRIC ACID 500-334 MG/5ML PO SOLN: 30.0000 mL | ORAL | Status: DC | PRN

## 2018-06-26 MED ORDER — DIPHENHYDRAMINE HCL 25 MG PO CAPS: 25.0000 mg | ORAL_CAPSULE | Freq: Four times a day (QID) | ORAL | Status: DC | PRN

## 2018-06-26 MED ORDER — DIBUCAINE 1 % RE OINT: 1.0000 "application " | TOPICAL_OINTMENT | RECTAL | Status: DC | PRN

## 2018-06-26 MED ORDER — LACTATED RINGERS IV SOLN: 500.0000 mL | Freq: Once | INTRAVENOUS | Status: DC

## 2018-06-26 MED ORDER — LACTATED RINGERS IV SOLN: 500.0000 mL | INTRAVENOUS | Status: DC | PRN

## 2018-06-26 MED ORDER — TETANUS-DIPHTH-ACELL PERTUSSIS 5-2.5-18.5 LF-MCG/0.5 IM SUSP: 0.5000 mL | Freq: Once | INTRAMUSCULAR | Status: DC

## 2018-06-26 MED ORDER — LACTATED RINGERS IV SOLN
INTRAVENOUS | Status: DC
  Administered 2018-06-26: 08:00:00 via INTRAVENOUS

## 2018-06-26 MED ORDER — SENNOSIDES-DOCUSATE SODIUM 8.6-50 MG PO TABS
2.0000 | ORAL_TABLET | ORAL | Status: DC
  Administered 2018-06-27: 2 via ORAL
  Filled 2018-06-26: qty 2

## 2018-06-26 MED ORDER — OXYCODONE-ACETAMINOPHEN 5-325 MG PO TABS: 1.0000 | ORAL_TABLET | ORAL | Status: DC | PRN

## 2018-06-26 MED ORDER — LIDOCAINE HCL (PF) 1 % IJ SOLN
30.0000 mL | INTRAMUSCULAR | Status: DC | PRN
  Filled 2018-06-26: qty 30

## 2018-06-26 MED ORDER — PHENYLEPHRINE 40 MCG/ML (10ML) SYRINGE FOR IV PUSH (FOR BLOOD PRESSURE SUPPORT)
80.0000 ug | PREFILLED_SYRINGE | INTRAVENOUS | Status: DC | PRN
  Administered 2018-06-26 (×2): 80 ug via INTRAVENOUS
  Filled 2018-06-26: qty 10

## 2018-06-26 MED ORDER — COCONUT OIL OIL: 1.0000 "application " | TOPICAL_OIL | Status: DC | PRN

## 2018-06-26 MED ORDER — PRENATAL MULTIVITAMIN CH
1.0000 | ORAL_TABLET | Freq: Every day | ORAL | Status: DC
  Administered 2018-06-27: 1 via ORAL
  Filled 2018-06-26: qty 1

## 2018-06-26 NOTE — MAU Note (Signed)
Contractions started at 0605, are every 3-5 min. No bleeding or leaking.  Was 2/50 on Friday.

## 2018-06-26 NOTE — Anesthesia Preprocedure Evaluation (Signed)

## 2018-06-26 NOTE — Anesthesia Pain Management Evaluation Note (Signed)
  CRNA Pain Management Visit Note  Patient: Emma Mcdonald, 32 y.o., female  "Hello I am a member of the anesthesia team at [facility]. We have an anesthesia team available at all times to provide care throughout the hospital, including epidural management and anesthesia for C-section. I don't know your plan for the delivery whether it a natural birth, water birth, IV sedation, nitrous supplementation, doula or epidural, but we want to meet your pain goals."   1.Was your pain managed to your expectations on prior hospitalizations?   Yes   2.What is your expectation for pain management during this hospitalization?     Epidural  3.How can we help you reach that goal? Support PRN  Record the patient's initial score and the patient's pain goal.   Pain: 0 , prior to epidural placement it was an 8  Pain Goal: 8 [facility] wants you to be able to say your pain was always managed very well.  Jennelle HumanLisa B Jovonne Wilton 06/26/2018

## 2018-06-26 NOTE — Anesthesia Procedure Notes (Signed)
Epidural Patient location during procedure: OB  Staffing Anesthesiologist: Melenie Minniear, MD Performed: anesthesiologist   Preanesthetic Checklist Completed: patient identified, site marked, surgical consent, pre-op evaluation, timeout performed, IV checked, risks and benefits discussed and monitors and equipment checked  Epidural Patient position: sitting Prep: DuraPrep Patient monitoring: heart rate, continuous pulse ox and blood pressure Approach: right paramedian Location: L3-L4 Injection technique: LOR saline  Needle:  Needle type: Tuohy  Needle gauge: 17 G Needle length: 9 cm and 9 Needle insertion depth: 5 cm Catheter type: closed end flexible Catheter size: 20 Guage Catheter at skin depth: 9 cm Test dose: negative  Assessment Events: blood not aspirated, injection not painful, no injection resistance, negative IV test and no paresthesia  Additional Notes Patient identified. Risks/Benefits/Options discussed with patient including but not limited to bleeding, infection, nerve damage, paralysis, failed block, incomplete pain control, headache, blood pressure changes, nausea, vomiting, reactions to medication both or allergic, itching and postpartum back pain. Confirmed with bedside nurse the patient's most recent platelet count. Confirmed with patient that they are not currently taking any anticoagulation, have any bleeding history or any family history of bleeding disorders. Patient expressed understanding and wished to proceed. All questions were answered. Sterile technique was used throughout the entire procedure. Please see nursing notes for vital signs. Test dose was given through epidural needle and negative prior to continuing to dose epidural or start infusion. Warning signs of high block given to the patient including shortness of breath, tingling/numbness in hands, complete motor block, or any concerning symptoms with instructions to call for help. Patient was given  instructions on fall risk and not to get out of bed. All questions and concerns addressed with instructions to call with any issues.     

## 2018-06-26 NOTE — H&P (Addendum)
LABOR AND DELIVERY ADMISSION HISTORY AND PHYSICAL NOTE  Emma Mcdonald is a 32 y.o. female [phone] with IUP at [redacted]w[redacted]d by LMP presenting for SOL.  She reports positive fetal movement. She denies leakage of fluid or vaginal bleeding.  Prenatal History/Complications: PNC at [facility] Pregnancy complications:  - none  Past Medical History: History reviewed. No pertinent past medical history.  Past Surgical History: History reviewed. No pertinent surgical history.  Obstetrical History: OB History    Gravida  5   Para  3   Term  3   Preterm  0   AB  1   Living  3     SAB  0   TAB  1   Ectopic  0   Multiple  0   Live Births  3           Social History: Social History   Socioeconomic History  . Marital status: Married    Spouse name: Not on file  . Number of children: Not on file  . Years of education: Not on file  . Highest education level: Not on file  Occupational History  . Not on file  Social Needs  . Financial resource strain: Not hard at all  . Food insecurity:    Worry: Patient refused    Inability: Patient refused  . Transportation needs:    Medical: Patient refused    Non-medical: Patient refused  Tobacco Use  . Smoking status: Former Smoker    Last attempt to quit: 10/2017    Years since quitting: 0.7  . Smokeless tobacco: Never Used  Substance and Sexual Activity  . Alcohol use: Yes    Alcohol/week: 1.0 standard drinks    Types: 1 Glasses of wine per week    Comment: none during pregnancy  . Drug use: No  . Sexual activity: Yes    Partners: Male  Lifestyle  . Physical activity:    Days per week: Patient refused    Minutes per session: Patient refused  . Stress: Not on file  Relationships  . Social connections:    Talks on phone: Patient refused    Gets together: Patient refused    Attends religious service: Patient refused    Active member of club or organization: Patient refused    Attends meetings of clubs or  organizations: Patient refused    Relationship status: Patient refused  Other Topics Concern  . Not on file  Social History Narrative  . Not on file    Family History: Family History  Problem Relation Age of Onset  . Breast cancer Mother   . Other Neg Hx     Allergies: No Known Allergies  Medications Prior to Admission  Medication Sig Dispense Refill Last Dose  . calcium carbonate (TUMS - DOSED IN MG ELEMENTAL CALCIUM) 500 MG chewable tablet Chew 1 tablet by mouth daily.   Taking  . omeprazole (PRILOSEC) 20 MG capsule Take 1 capsule (20 mg total) by mouth 2 (two) times daily before a meal. (Patient not taking: Reported on 05/10/2018) 60 capsule 5 Not Taking  . Prenat-FeBis-FePro-FA-CA-Omega (COMPLETE NATAL DHA) 29-1-200 & 250 MG MISC Take 1 tablet by mouth daily before breakfast. (Patient not taking: Reported on 06/05/2018) 90 each 3 Not Taking  . Prenatal Vit w/Fe-Methylfol-FA (PNV PO) Take by mouth.   Taking  . prenatal vitamin w/FE, FA (PRENATAL 1 + 1) 27-1 MG TABS tablet Take 1 tablet by mouth daily before breakfast. (Patient not taking: Reported on  06/05/2018) 30 each 11 Not Taking     Review of Systems  All systems reviewed and negative except as stated in HPI  Physical Exam Blood pressure 112/66, pulse 99, temperature 98.3 F (36.8 C), resp. rate 18, height 5' 4.5" (1.638 m), weight 83.3 kg, last menstrual period 09/25/2017, unknown if currently breastfeeding. General appearance: alert, oriented, NAD Lungs: normal respiratory effort Heart: regular rate Abdomen: soft, non-tender; gravid, FH appropriate for GA Extremities: No calf swelling or tenderness Presentation: cephalic Fetal monitoring: category I Uterine activity: 5-6 minutes  Dilation: 6 Effacement (%): 60 Station: -2 Exam by:: Dorrene GermanJ. Lowe RN  Prenatal labs: ABO, Rh: O/Positive/-- (08/19 1423) Antibody: Negative (08/19 1423) Rubella: 1.59 (08/19 1423) RPR: Non Reactive (10/02 1135)  HBsAg: Negative (08/19  1423)  HIV: Non Reactive (10/02 1135)  GC/Chlamydia: negative GBS: Negative (11/25 1116)  2-hr GTT: negative Genetic screening:  unknown Anatomy US: unknown  Prenatal Transfer Tool  Maternal Diabetes: No Genetic Screening:unknown Maternal Ultrasounds/Referrals:unknown Fetal Ultrasounds or other Referrals: unknown Maternal Substance Abuse:  No Significant Maternal Medications:  None Significant Maternal Lab Results: None  Results for orders placed or performed during the hospital encounter of 06/26/18 (from the past 24 hour(s))  CBC   Collection Time: 06/26/18  8:23 AM  Result Value Ref Range   WBC 9.3 4.0 - 10.5 K/uL   RBC 4.14 3.87 - 5.11 MIL/uL   Hemoglobin 12.3 12.0 - 15.0 g/dL   HCT 16.138.4 09.636.0 - 04.546.0 %   MCV 92.8 80.0 - 100.0 fL   MCH 29.7 26.0 - 34.0 pg   MCHC 32.0 30.0 - 36.0 g/dL   RDW 40.915.0 81.111.5 - 91.415.5 %   Platelets 296 150 - 400 K/uL   nRBC 0.0 0.0 - 0.2 %    Patient Active Problem List   Diagnosis Date Noted  . Normal labor 06/26/2018  . Supervision of other normal pregnancy, antepartum 02/27/2018  . Active labor at term 10/25/2015  . NSVD (normal spontaneous vaginal delivery) 10/25/2015  . Normal delivery 07/03/2012    Assessment: Emma Mcdonald is a 32 y.o. [id number] at 1356w1d here for SOL.  #Labor: expectant management #Pain: Epidural in place #FWB: Category I #ID:  GBS neg #MOF: bottle #MOC:depo #Circ:  Yes- outpatient  Jamelle Rushinghelsey Anderson DO 06/26/2018, 9:09 AM   Midwife attestation: I have seen and examined this patient; I agree with above documentation in the resident's note.   PE: Gen: calm comfortable, NAD Resp: normal effort and rate Abd: gravid  ROS, labs, PMH reviewed  Assessment/Plan: Emma Mcdonald is a 32 y.o. [phone] here for labor Admit to LD Labor: active FWB: Cat I ID: GBS neg Anticipate labor progression and SVD  Donette LarryMelanie Kynedi Profitt, CNM  06/26/2018, 11:16 AM

## 2018-06-26 NOTE — Anesthesia Postprocedure Evaluation (Signed)
Anesthesia Post Note  Patient: Emma Mcdonald  Procedure(s) Performed: AN AD HOC LABOR EPIDURAL     Anesthesia Type: Epidural    Last Vitals:  Vitals:   06/26/18 1510 06/26/18 1622  BP: (!) 106/54 99/66  Pulse: 76 85  Resp: 18 20  Temp: 36.8 C 36.7 C    Last Pain:  Vitals:   06/26/18 1749  TempSrc:   PainSc: 0-No pain   Pain Goal: Patients Stated Pain Goal: 4 (06/26/18 0904)               Cephus ShellingBURGER,Oluwatamilore Starnes

## 2018-06-27 LAB — CBC
HEMATOCRIT: 35.8 % — AB (ref 36.0–46.0)
HEMOGLOBIN: 11.3 g/dL — AB (ref 12.0–15.0)
MCH: 28.9 pg (ref 26.0–34.0)
MCHC: 31.6 g/dL (ref 30.0–36.0)
MCV: 91.6 fL (ref 80.0–100.0)
Platelets: 270 10*3/uL (ref 150–400)
RBC: 3.91 MIL/uL (ref 3.87–5.11)
RDW: 15.1 % (ref 11.5–15.5)
WBC: 13.5 10*3/uL — ABNORMAL HIGH (ref 4.0–10.5)
nRBC: 0 % (ref 0.0–0.2)

## 2018-06-27 MED ORDER — IBUPROFEN 600 MG PO TABS: 600.0000 mg | ORAL_TABLET | Freq: Four times a day (QID) | ORAL | 1 refills | Status: DC

## 2018-06-27 MED ORDER — SENNOSIDES-DOCUSATE SODIUM 8.6-50 MG PO TABS: 2.0000 | ORAL_TABLET | ORAL | 0 refills | Status: DC

## 2018-06-27 NOTE — Discharge Instructions (Signed)
Postpartum Care After Vaginal Delivery °The period of time right after you deliver your newborn is called the postpartum period. °What kind of medical care will I receive? °· You may continue to receive fluids and medicines through an IV tube inserted into one of your veins. °· If an incision was made near your vagina (episiotomy) or if you had some vaginal tearing during delivery, cold compresses may be placed on your episiotomy or your tear. This helps to reduce pain and swelling. °· You may be given a squirt bottle to use when you go to the bathroom. You may use this until you are comfortable wiping as usual. To use the squirt bottle, follow these steps: °? Before you urinate, fill the squirt bottle with warm water. Do not use hot water. °? After you urinate, while you are sitting on the toilet, use the squirt bottle to rinse the area around your urethra and vaginal opening. This rinses away any urine and blood. °? You may do this instead of wiping. As you start healing, you may use the squirt bottle before wiping yourself. Make sure to wipe gently. °? Fill the squirt bottle with clean water every time you use the bathroom. °· You will be given sanitary pads to wear. °How can I expect to feel? °· You may not feel the need to urinate for several hours after delivery. °· You will have some soreness and pain in your abdomen and vagina. °· If you are breastfeeding, you may have uterine contractions every time you breastfeed for up to several weeks postpartum. Uterine contractions help your uterus return to its normal size. °· It is normal to have vaginal bleeding (lochia) after delivery. The amount and appearance of lochia is often similar to a menstrual period in the first week after delivery. It will gradually decrease over the next few weeks to a dry, yellow-brown discharge. For most women, lochia stops completely by 6-8 weeks after delivery. Vaginal bleeding can vary from woman to woman. °· Within the first few  days after delivery, you may have breast engorgement. This is when your breasts feel heavy, full, and uncomfortable. Your breasts may also throb and feel hard, tightly stretched, warm, and tender. After this occurs, you may have milk leaking from your breasts. Your health care provider can help you relieve discomfort due to breast engorgement. Breast engorgement should go away within a few days. °· You may feel more sad or worried than normal due to hormonal changes after delivery. These feelings should not last more than a few days. If these feelings do not go away after several days, speak with your health care provider. °How should I care for myself? °· Tell your health care provider if you have pain or discomfort. °· Drink enough water to keep your urine clear or pale yellow. °· Wash your hands thoroughly with soap and water for at least 20 seconds after changing your sanitary pads, after using the toilet, and before holding or feeding your baby. °· If you are not breastfeeding, avoid touching your breasts a lot. Doing this can make your breasts produce more milk. °· If you become weak or lightheaded, or you feel like you might faint, ask for help before: °? Getting out of bed. °? Showering. °· Change your sanitary pads frequently. Watch for any changes in your flow, such as a sudden increase in volume, a change in color, the passing of large blood clots. If you pass a blood clot from your vagina, save it   to show to your health care provider. Do not flush blood clots down the toilet without having your health care provider look at them. °· Make sure that all your vaccinations are up to date. This can help protect you and your baby from getting certain diseases. You may need to have immunizations done before you leave the hospital. °· If desired, talk with your health care provider about methods of family planning or birth control (contraception). °How can I start bonding with my baby? °Spending as much time as  possible with your baby is very important. During this time, you and your baby can get to know each other and develop a bond. Having your baby stay with you in your room (rooming in) can give you time to get to know your baby. Rooming in can also help you become comfortable caring for your baby. Breastfeeding can also help you bond with your baby. °How can I plan for returning home with my baby? °· Make sure that you have a car seat installed in your vehicle. °? Your car seat should be checked by a certified car seat installer to make sure that it is installed safely. °? Make sure that your baby fits into the car seat safely. °· Ask your health care provider any questions you have about caring for yourself or your baby. Make sure that you are able to contact your health care provider with any questions after leaving the hospital. °This information is not intended to replace advice given to you by your health care provider. Make sure you discuss any questions you have with your health care provider. °Document Released: 04/25/2007 Document Revised: 12/01/2015 Document Reviewed: 06/02/2015 °Elsevier Interactive Patient Education © 2018 [employer] ° °

## 2018-06-27 NOTE — Discharge Summary (Signed)
OB Discharge Summary     Patient Name: Emma Mcdonald DOB: [birth year] MRN: [id number]  Date of admission: 06/26/2018 Delivering MD: Leeroy Bock   Date of discharge: 06/27/2018  Admitting diagnosis: 39WKS 3-4MIN CONTRACTIONS Intrauterine pregnancy: [redacted]w[redacted]d     Secondary diagnosis:  Active Problems:   Normal labor  Additional problems: None      Discharge diagnosis: Term Pregnancy Delivered                                                                                                Post partum procedures:None  Augmentation: None  Complications: None  Hospital course:  Onset of Labor With Vaginal Delivery     32 y.o. yo [id number] at [redacted]w[redacted]d was admitted in Active Labor on 06/26/2018. Patient had an uncomplicated labor course as follows:  Membrane Rupture Time/Date: 10:29 AM ,06/26/2018   Intrapartum Procedures: Episiotomy: None [1]                                         Lacerations:  None [1]  Patient had a delivery of a Viable infant. 06/26/2018  Information for the patient's newborn:  Dietrich, Ke [[id number]]  Delivery Method: Vaginal, Spontaneous(Filed from Delivery Summary)    Pateint had an uncomplicated postpartum course.  She is ambulating, tolerating a regular diet, passing flatus, and urinating well. Patient is discharged home in stable condition on 06/27/18.   Physical exam  Vitals:   06/26/18 1622 06/26/18 2007 06/27/18 0003 06/27/18 0549  BP: 99/66 102/67 (!) 99/57 (!) 98/56  Pulse: 85 80 83 75  Resp: 20 18 18 16   Temp: 98 F (36.7 C) 98.2 F (36.8 C) 98.3 F (36.8 C) 97.9 F (36.6 C)  TempSrc: Oral Oral Oral   SpO2:   98%   Weight:      Height:       General: alert and cooperative Lochia: appropriate Uterine Fundus: firm Incision: N/A DVT Evaluation: No evidence of DVT seen on physical exam. Labs: Lab Results  Component Value Date   WBC 13.5 (H) 06/27/2018   HGB 11.3 (L) 06/27/2018   HCT 35.8 (L) 06/27/2018   MCV  91.6 06/27/2018   PLT 270 06/27/2018   CMP Latest Ref Rng & Units 09/03/2015  Glucose 65 - 99 mg/dL 78  BUN 6 - 20 mg/dL <8(I)  Creatinine 6.96 - 1.00 mg/dL 2.95  Sodium 284 - 132 mmol/L 138  Potassium 3.5 - 5.1 mmol/L 3.8  Chloride 101 - 111 mmol/L 109  CO2 22 - 32 mmol/L 20(L)  Calcium 8.9 - 10.3 mg/dL 4.4(W)    Discharge instruction: per After Visit Summary and "Baby and Me Booklet".  After visit meds:  Allergies as of 06/27/2018   No Known Allergies     Medication List    TAKE these medications   calcium carbonate 500 MG chewable tablet Commonly known as:  TUMS - dosed in mg elemental calcium Chew 1 tablet by mouth daily.   ibuprofen 600 MG tablet  Commonly known as:  ADVIL,MOTRIN Take 1 tablet (600 mg total) by mouth every 6 (six) hours.   PNV PO Take by mouth.   senna-docusate 8.6-50 MG tablet Commonly known as:  Senokot-S Take 2 tablets by mouth daily. Start taking on:  June 28, 2018       Diet: routine diet  Activity: Advance as tolerated. Pelvic rest for 6 weeks.   Outpatient follow up:4 weeks Follow up Appt: Future Appointments  Date Time Provider Department Center  07/24/2018  1:30 PM Brock BadHarper, [name], MD [facility] None   Follow up Visit:No follow-ups on file.  Postpartum contraception: Depo Provera  Newborn Data: Live born female  Birth Weight: 6 lb 6.1 oz (2895 g) APGAR: 9, 9  Newborn Delivery   Birth date/time:  06/26/2018 13:27:00 Delivery type:  Vaginal, Spontaneous     Baby Feeding: Bottle Disposition:home with mother   06/27/2018 De Hollingsheadatherine L Tennie Grussing, DO

## 2018-06-30 ENCOUNTER — Encounter: Payer: Self-pay | Admitting: Obstetrics

## 2018-07-24 ENCOUNTER — Ambulatory Visit: Payer: Self-pay | Admitting: Obstetrics

## 2018-08-02 ENCOUNTER — Encounter: Payer: Self-pay | Admitting: Obstetrics and Gynecology

## 2018-08-02 ENCOUNTER — Ambulatory Visit (INDEPENDENT_AMBULATORY_CARE_PROVIDER_SITE_OTHER): Payer: Medicaid Other | Admitting: Obstetrics and Gynecology

## 2018-08-02 VITALS — BP 104/71 | HR 77 | Wt 166.7 lb

## 2018-08-02 DIAGNOSIS — Z803 Family history of malignant neoplasm of breast: Secondary | ICD-10-CM | POA: Insufficient documentation

## 2018-08-02 DIAGNOSIS — Z3202 Encounter for pregnancy test, result negative: Secondary | ICD-10-CM | POA: Diagnosis not present

## 2018-08-02 DIAGNOSIS — Z30013 Encounter for initial prescription of injectable contraceptive: Secondary | ICD-10-CM

## 2018-08-02 DIAGNOSIS — Z01818 Encounter for other preprocedural examination: Secondary | ICD-10-CM

## 2018-08-02 LAB — POCT URINE PREGNANCY: PREG TEST UR: NEGATIVE

## 2018-08-02 MED ORDER — MEDROXYPROGESTERONE ACETATE 150 MG/ML IM SUSP
150.0000 mg | Freq: Once | INTRAMUSCULAR | Status: AC
  Administered 2018-08-02: 150 mg via INTRAMUSCULAR

## 2018-08-02 NOTE — Patient Instructions (Addendum)
Supplement with vitamin D 1000 IU per day and calcium 1200mg  per day   Patient is to return for next Depo Provera injection between 04/9 - 11/02/2018.

## 2018-08-02 NOTE — Progress Notes (Signed)
Obstetrics and Gynecology Postpartum Visit  Appointment Date: 08/02/2018  OBGYN Clinic: [facility]  Primary Care Provider: Patient, No Pcp Per  Chief Complaint:  Chief Complaint  Patient presents with  . Postpartum Care    History of Present Illness: Emma Mcdonald is a 33 y.o. African-American [id number] (Patient's last menstrual period was 07/24/2018 (approximate).), seen for the above chief complaint. Her past medical history is significant for nothing   She is s/p 12/16 on SVD/intact perineum; she was discharged to home on PPD#1  Vaginal bleeding or discharge: No  Breast or formula feeding: formula Intercourse: No  Contraception after delivery: abstinence PP depression s/s: No  Any bowel or bladder issues: No  Pap smear: no abnormalities (date: 2019)  Review of Systems: as noted in the History of Present Illness.  Patient Active Problem List   Diagnosis Date Noted  . Family history of breast cancer in mother 08/02/2018  . Supervision of other normal pregnancy, antepartum 02/27/2018    Medications We administered medroxyPROGESTERone. Current Outpatient Medications  Medication Sig Dispense Refill  . ibuprofen (ADVIL,MOTRIN) 600 MG tablet Take 1 tablet (600 mg total) by mouth every 6 (six) hours. 60 tablet 1  . Prenatal Vit w/Fe-Methylfol-FA (PNV PO) Take by mouth.     No current facility-administered medications for this visit.     Allergies Patient has no known allergies.  Physical Exam:  BP 104/71 (BP Location: Right Arm, Patient Position: Sitting, Cuff Size: Normal)   Pulse 77   Wt 166 lb 11.2 oz (75.6 kg)   LMP 07/24/2018 (Approximate)   Breastfeeding No   BMI 28.17 kg/m  Body mass index is 28.17 kg/m. General appearance: Well nourished, well developed female in no acute distress.  Cardiovascular: normal s1 and s2.  No murmurs, rubs or gallops. Respiratory:  Clear to auscultation bilateral. Normal respiratory effort Abdomen: positive bowel sounds and  no masses, hernias; diffusely non tender to palpation, non distended Neuro/Psych:  Normal mood and affect.  Skin:  Warm and dry.   Laboratory: UPT negative  PP Depression Screening:  EPDS score 0  Assessment: pt doing well  Plan:  Depo provera today. Pt used it in the past. Recommend extra vitamin d and calcium. Told to consider effective in one week  Mom with negative genetic testing. Recommend starting mammograms 10 years prior to mom's dx (approx at 46-40 y/o for the patient)  RTC PRN  Edmund Bing, Montez Hageman MD Attending Center for [employer] [facility])

## 2018-10-23 ENCOUNTER — Ambulatory Visit: Payer: Medicaid Other

## 2018-10-24 ENCOUNTER — Other Ambulatory Visit: Payer: Self-pay

## 2018-10-24 MED ORDER — MEDROXYPROGESTERONE ACETATE 150 MG/ML IM SUSY: 1.0000 mL | PREFILLED_SYRINGE | INTRAMUSCULAR | 2 refills | Status: DC

## 2018-10-24 NOTE — Progress Notes (Signed)
No refills on file confirmed with [facility].  Pt's Depo injection scheduled for tomorrow.

## 2018-10-26 ENCOUNTER — Ambulatory Visit (INDEPENDENT_AMBULATORY_CARE_PROVIDER_SITE_OTHER): Payer: Medicaid Other

## 2018-10-26 ENCOUNTER — Ambulatory Visit: Payer: Medicaid Other

## 2018-10-26 ENCOUNTER — Other Ambulatory Visit: Payer: Self-pay

## 2018-10-26 DIAGNOSIS — Z3042 Encounter for surveillance of injectable contraceptive: Secondary | ICD-10-CM

## 2018-10-26 MED ORDER — MEDROXYPROGESTERONE ACETATE 150 MG/ML IM SUSP
150.0000 mg | INTRAMUSCULAR | Status: AC
  Administered 2018-10-26 – 2021-07-22 (×7): 150 mg via INTRAMUSCULAR

## 2018-10-26 NOTE — Progress Notes (Signed)
Nurse visit for pt supply Depo given RUOQ w/o difficulty.  Next Depo due Jul 2-16, pt agrees.

## 2018-10-30 NOTE — Progress Notes (Signed)
Patient ID: Emma Mcdonald, female   DOB: 1985-07-20, 33 y.o.   MRN: [id number] I have reviewed the chart and agree with nursing staff's documentation of this patient's encounter.  Scheryl Darter, MD 10/30/2018 9:32 AM

## 2018-12-26 ENCOUNTER — Ambulatory Visit: Payer: Medicaid Other

## 2019-01-17 ENCOUNTER — Ambulatory Visit (INDEPENDENT_AMBULATORY_CARE_PROVIDER_SITE_OTHER): Payer: Medicaid Other

## 2019-01-17 ENCOUNTER — Other Ambulatory Visit: Payer: Self-pay

## 2019-01-17 DIAGNOSIS — Z3042 Encounter for surveillance of injectable contraceptive: Secondary | ICD-10-CM

## 2019-01-17 MED ORDER — MEDROXYPROGESTERONE ACETATE 150 MG/ML IM SUSP
150.0000 mg | Freq: Once | INTRAMUSCULAR | Status: AC
  Administered 2019-01-17: 150 mg via INTRAMUSCULAR

## 2019-01-17 NOTE — Progress Notes (Signed)
Pt is here for contraception, depo injection. Pt is on time and within window. Injection given in RUOQ without difficulty. Next depo due 9/23/-10/7.

## 2019-02-01 NOTE — Progress Notes (Signed)
Patient ID: Emma Mcdonald, female   DOB: 05/05/1986, 33 y.o.   MRN: [id number] Patient seen and assessed by nursing staff during this encounter. I have reviewed the chart and agree with the documentation and plan.  Emeterio Reeve, MD 02/01/2019 11:13 AM

## 2019-04-05 ENCOUNTER — Ambulatory Visit: Payer: Medicaid Other | Admitting: Obstetrics

## 2019-04-12 ENCOUNTER — Ambulatory Visit (INDEPENDENT_AMBULATORY_CARE_PROVIDER_SITE_OTHER): Payer: Medicaid Other | Admitting: Obstetrics

## 2019-04-12 ENCOUNTER — Encounter: Payer: Self-pay | Admitting: Obstetrics

## 2019-04-12 ENCOUNTER — Other Ambulatory Visit: Payer: Self-pay

## 2019-04-12 ENCOUNTER — Other Ambulatory Visit (HOSPITAL_COMMUNITY)
Admission: RE | Admit: 2019-04-12 | Discharge: 2019-04-12 | Disposition: A | Discharge status: Home / Self Care | Payer: Medicaid Other | Source: Ambulatory Visit | Attending: Obstetrics | Admitting: Obstetrics

## 2019-04-12 VITALS — BP 104/77 | HR 83 | Ht 64.0 in | Wt 164.0 lb

## 2019-04-12 DIAGNOSIS — Z01419 Encounter for gynecological examination (general) (routine) without abnormal findings: Secondary | ICD-10-CM | POA: Insufficient documentation

## 2019-04-12 DIAGNOSIS — Z3042 Encounter for surveillance of injectable contraceptive: Secondary | ICD-10-CM | POA: Diagnosis not present

## 2019-04-12 DIAGNOSIS — N898 Other specified noninflammatory disorders of vagina: Secondary | ICD-10-CM | POA: Insufficient documentation

## 2019-04-12 DIAGNOSIS — Z113 Encounter for screening for infections with a predominantly sexual mode of transmission: Secondary | ICD-10-CM

## 2019-04-12 DIAGNOSIS — Z0000 Encounter for general adult medical examination without abnormal findings: Secondary | ICD-10-CM | POA: Diagnosis not present

## 2019-04-12 MED ORDER — MEDROXYPROGESTERONE ACETATE 150 MG/ML IM SUSP
150.0000 mg | Freq: Once | INTRAMUSCULAR | Status: AC
  Administered 2019-04-12: 16:00:00 150 mg via INTRAMUSCULAR

## 2019-04-12 NOTE — Progress Notes (Signed)
Pt is here for annual exam. Last pap 02/27/18 normal. Depo for contraception.

## 2019-04-12 NOTE — Progress Notes (Signed)
Subjective:        Emma Mcdonald is a 33 y.o. female here for a routine exam.  Current complaints: None.    Personal health questionnaire:  Is patient Ashkenazi Jewish, have a family history of breast and/or ovarian cancer: yes Is there a family history of uterine cancer diagnosed at age < 18, gastrointestinal cancer, urinary tract cancer, family member who is a Field seismologist syndrome-associated carrier: no Is the patient overweight and hypertensive, family history of diabetes, personal history of gestational diabetes, preeclampsia or PCOS: no Is patient over 5, have PCOS,  family history of premature CHD under age 6, diabetes, smoke, have hypertension or peripheral artery disease:  no At any time, has a partner hit, kicked or otherwise hurt or frightened you?: no Over the past 2 weeks, have you felt down, depressed or hopeless?: no Over the past 2 weeks, have you felt little interest or pleasure in doing things?:no   Gynecologic History No LMP recorded. Patient has had an injection. Contraception: Depo-Provera injections Last Pap: 02-27-2018. Results were: normal Last mammogram: n/a. Results were: n/a  Obstetric History OB History  Gravida Para Term Preterm AB Living  5 4 4  0 1 4  SAB TAB Ectopic Multiple Live Births  0 1 0 0 4    # Outcome Date GA Lbr Len/2nd Weight Sex Delivery Anes PTL Lv  5 Term 06/26/18 [redacted]w[redacted]d 06:31 / 00:56 6 lb 6.1 oz (2.895 kg) M Vag-Spont EPI  LIV  4 Term 10/25/15 [redacted]w[redacted]d 06:10 / 00:31 6 lb 13.5 oz (3.105 kg) M Vag-Spont EPI  LIV     Birth Comments: WNL  3 Term 07/02/12 [redacted]w[redacted]d 05:37 / 00:20 5 lb 12.4 oz (2.62 kg) F Vag-Spont EPI  LIV  2 TAB 03/12/10          1 Term 02/13/09 [redacted]w[redacted]d  5 lb 4 oz (2.381 kg) F Vag-Spont EPI N LIV    History reviewed. No pertinent past medical history.  History reviewed. No pertinent surgical history.   Current Outpatient Medications:  .  ibuprofen (ADVIL,MOTRIN) 600 MG tablet, Take 1 tablet (600 mg total) by mouth every  6 (six) hours., Disp: 60 tablet, Rfl: 1 .  medroxyPROGESTERone Acetate 150 MG/ML SUSY, Inject 1 mL (150 mg total) into the muscle every 3 (three) months., Disp: 0.9 mL, Rfl: 2 .  Prenatal Vit w/Fe-Methylfol-FA (PNV PO), Take by mouth., Disp: , Rfl:   Current Facility-Administered Medications:  .  medroxyPROGESTERone (DEPO-PROVERA) injection 150 mg, 150 mg, Intramuscular, Q90 days, Woodroe Mode, MD, 150 mg at 10/26/18 0920 No Known Allergies  Social History   Tobacco Use  . Smoking status: Former Smoker    Quit date: 10/2017    Years since quitting: 1.5  . Smokeless tobacco: Never Used  Substance Use Topics  . Alcohol use: Yes    Alcohol/week: 1.0 standard drinks    Types: 1 Glasses of wine per week    Comment: none during pregnancy    Family History  Problem Relation Age of Onset  . Breast cancer Mother   . Other Neg Hx       Review of Systems  Constitutional: negative for fatigue and weight loss Respiratory: negative for cough and wheezing Cardiovascular: negative for chest pain, fatigue and palpitations Gastrointestinal: negative for abdominal pain and change in bowel habits Musculoskeletal:negative for myalgias Neurological: negative for gait problems and tremors Behavioral/Psych: negative for abusive relationship, depression Endocrine: negative for temperature intolerance    Genitourinary:negative for abnormal menstrual  periods, genital lesions, hot flashes, sexual problems and vaginal discharge Integument/breast: negative for breast lump, breast tenderness, nipple discharge and skin lesion(s)    Objective:       BP 104/77   Pulse 83   Ht 5\' 4"  (1.626 m)   Wt 164 lb (74.4 kg)   Breastfeeding No   BMI 28.15 kg/m  General:   alert  Skin:   no rash or abnormalities  Lungs:   clear to auscultation bilaterally  Heart:   regular rate and rhythm, S1, S2 normal, no murmur, click, rub or gallop  Breasts:   normal without suspicious masses, skin or nipple changes  or axillary nodes  Abdomen:  normal findings: no organomegaly, soft, non-tender and no hernia  Pelvis:  External genitalia: normal general appearance Urinary system: urethral meatus normal and bladder without fullness, nontender Vaginal: normal without tenderness, induration or masses Cervix: normal appearance Adnexa: normal bimanual exam Uterus: anteverted and non-tender, normal size   Lab Review Urine pregnancy test Labs reviewed yes Radiologic studies reviewed no  50% of 25 min visit spent on counseling and coordination of care.   Assessment:    Healthy female exam.    Plan:    Education reviewed: calcium supplements, depression evaluation, low fat, low cholesterol diet, safe sex/STD prevention, self breast exams and weight bearing exercise. Contraception: Depo-Provera injections. Follow up in: 1 year.   Meds ordered this encounter  Medications  . medroxyPROGESTERone (DEPO-PROVERA) injection 150 mg   Orders Placed This Encounter  Procedures  . HIV Antibody (routine testing w rflx)  . RPR    , MD 04/12/2019 3:58 PM

## 2019-04-13 LAB — CERVICOVAGINAL ANCILLARY ONLY
Chlamydia: NEGATIVE
Neisseria Gonorrhea: NEGATIVE

## 2019-04-13 LAB — RPR: RPR Ser Ql: NONREACTIVE

## 2019-04-13 LAB — HIV ANTIBODY (ROUTINE TESTING W REFLEX): HIV Screen 4th Generation wRfx: NONREACTIVE

## 2019-04-18 LAB — CYTOLOGY - PAP
Diagnosis: NEGATIVE
High risk HPV: NEGATIVE

## 2019-07-07 ENCOUNTER — Other Ambulatory Visit: Payer: Self-pay | Admitting: Family Medicine

## 2019-07-09 ENCOUNTER — Ambulatory Visit (INDEPENDENT_AMBULATORY_CARE_PROVIDER_SITE_OTHER): Payer: Medicaid Other | Admitting: *Deleted

## 2019-07-09 ENCOUNTER — Other Ambulatory Visit: Payer: Self-pay

## 2019-07-09 VITALS — BP 118/86 | HR 101 | Wt 163.0 lb

## 2019-07-09 DIAGNOSIS — Z3042 Encounter for surveillance of injectable contraceptive: Secondary | ICD-10-CM

## 2019-07-09 NOTE — Progress Notes (Signed)
Pt is in office for depo injection.  Pt is on time for Depo. Injection given, pt tolerated well.   Pt advised to RTO 3/15-3/29/21 for next depo.  Pt has no other concerns.  BP 118/86   Pulse (!) 101   Wt 163 lb (73.9 kg)   BMI 27.98 kg/m   Administrations This Visit    medroxyPROGESTERone (DEPO-PROVERA) injection 150 mg    Admin Date 07/09/2019 Action Given Dose 150 mg Route Intramuscular Administered By Valene Bors, CMA

## 2019-10-01 ENCOUNTER — Other Ambulatory Visit: Payer: Self-pay

## 2019-10-01 ENCOUNTER — Ambulatory Visit (INDEPENDENT_AMBULATORY_CARE_PROVIDER_SITE_OTHER): Payer: Medicaid Other | Admitting: *Deleted

## 2019-10-01 DIAGNOSIS — Z3042 Encounter for surveillance of injectable contraceptive: Secondary | ICD-10-CM

## 2019-10-01 NOTE — Progress Notes (Signed)
Patient seen and assessed by nursing staff during this encounter. I have reviewed the chart and agree with the documentation and plan.  Jaynie Collins, MD 10/01/2019 4:47 PM

## 2019-10-01 NOTE — Progress Notes (Signed)
Pt is in office for depo injection.  Pt is on time for Depo. Injection given, pt tolerated well.   Pt advised to RTO 6/7-6/21/21 for next depo.  Pt is doing well and has no other concerns.  Administrations This Visit    medroxyPROGESTERone (DEPO-PROVERA) injection 150 mg    Admin Date 10/01/2019 Action Given Dose 150 mg Route Intramuscular Administered By Lanney Gins, CMA

## 2019-12-20 ENCOUNTER — Ambulatory Visit: Payer: Medicaid Other

## 2019-12-21 ENCOUNTER — Ambulatory Visit (INDEPENDENT_AMBULATORY_CARE_PROVIDER_SITE_OTHER): Payer: Medicaid Other

## 2019-12-21 ENCOUNTER — Other Ambulatory Visit: Payer: Self-pay

## 2019-12-21 DIAGNOSIS — Z3042 Encounter for surveillance of injectable contraceptive: Secondary | ICD-10-CM

## 2019-12-21 MED ORDER — MEDROXYPROGESTERONE ACETATE 150 MG/ML IM SUSP
150.0000 mg | Freq: Once | INTRAMUSCULAR | Status: AC
  Administered 2019-12-21: 150 mg via INTRAMUSCULAR

## 2019-12-21 NOTE — Progress Notes (Signed)
I reviewed the nurses note and agree with the plan of care.   Alexiana Laverdure A, MD 10/07/2017 10:46 AM  

## 2019-12-21 NOTE — Progress Notes (Signed)
Emma Mcdonald is here for a depo injection. Pt tolerated injection well in the LUOQ without complication. Nest appointment should be between Aug. 26 - Sept. 10. -EH/RMA -EH/RMA

## 2020-03-10 ENCOUNTER — Other Ambulatory Visit: Payer: Self-pay

## 2020-03-10 ENCOUNTER — Ambulatory Visit: Payer: Medicaid Other

## 2020-03-10 MED ORDER — MEDROXYPROGESTERONE ACETATE 150 MG/ML IM SUSY: 1.0000 mL | PREFILLED_SYRINGE | INTRAMUSCULAR | 0 refills | Status: DC

## 2020-03-11 ENCOUNTER — Ambulatory Visit (INDEPENDENT_AMBULATORY_CARE_PROVIDER_SITE_OTHER): Payer: Medicaid Other

## 2020-03-11 ENCOUNTER — Other Ambulatory Visit: Payer: Self-pay

## 2020-03-11 DIAGNOSIS — Z3042 Encounter for surveillance of injectable contraceptive: Secondary | ICD-10-CM | POA: Diagnosis not present

## 2020-03-11 NOTE — Progress Notes (Signed)
Pt is in the office depo injection, administered in RUOQ and pt tolerated well. Next due Nov 16-30 .Marland Kitchen Administrations This Visit    medroxyPROGESTERone (DEPO-PROVERA) injection 150 mg    Admin Date 03/11/2020 Action Given Dose 150 mg Route Intramuscular Administered By Katrina Stack, RN

## 2020-03-12 NOTE — Progress Notes (Signed)
Patient was assessed and managed by nursing staff during this encounter. I have reviewed the chart and agree with the documentation and plan. I have also made any necessary editorial changes.  Jaynie Collins, MD 03/12/2020 9:36 AM

## 2020-04-14 ENCOUNTER — Ambulatory Visit: Payer: Medicaid Other | Admitting: Obstetrics

## 2020-04-22 ENCOUNTER — Other Ambulatory Visit (HOSPITAL_COMMUNITY)
Admission: RE | Admit: 2020-04-22 | Discharge: 2020-04-22 | Disposition: A | Discharge status: Home / Self Care | Payer: Medicaid Other | Source: Ambulatory Visit | Attending: Obstetrics | Admitting: Obstetrics

## 2020-04-22 ENCOUNTER — Ambulatory Visit (INDEPENDENT_AMBULATORY_CARE_PROVIDER_SITE_OTHER): Payer: Medicaid Other | Admitting: Obstetrics

## 2020-04-22 ENCOUNTER — Encounter: Payer: Self-pay | Admitting: Obstetrics

## 2020-04-22 ENCOUNTER — Other Ambulatory Visit: Payer: Self-pay

## 2020-04-22 VITALS — BP 108/78 | HR 89 | Ht 64.0 in | Wt 163.2 lb

## 2020-04-22 DIAGNOSIS — N898 Other specified noninflammatory disorders of vagina: Secondary | ICD-10-CM | POA: Diagnosis not present

## 2020-04-22 DIAGNOSIS — Z01419 Encounter for gynecological examination (general) (routine) without abnormal findings: Secondary | ICD-10-CM

## 2020-04-22 DIAGNOSIS — Z3042 Encounter for surveillance of injectable contraceptive: Secondary | ICD-10-CM | POA: Diagnosis not present

## 2020-04-22 NOTE — Progress Notes (Signed)
Pt is in the office for annual. Last pap 04-12-19. Currently on depo.

## 2020-04-22 NOTE — Progress Notes (Signed)
Subjective:        Emma Mcdonald is a 34 y.o. female here for a routine exam.  Current complaints: None.    Personal health questionnaire:  Is patient Ashkenazi Jewish, have a family history of breast and/or ovarian cancer: yes Is there a family history of uterine cancer diagnosed at age < 58, gastrointestinal cancer, urinary tract cancer, family member who is a Personnel officer syndrome-associated carrier: no Is the patient overweight and hypertensive, family history of diabetes, personal history of gestational diabetes, preeclampsia or PCOS: no Is patient over 28, have PCOS,  family history of premature CHD under age 67, diabetes, smoke, have hypertension or peripheral artery disease:  no At any time, has a partner hit, kicked or otherwise hurt or frightened you?: no Over the past 2 weeks, have you felt down, depressed or hopeless?: no Over the past 2 weeks, have you felt little interest or pleasure in doing things?:no   Gynecologic History No LMP recorded. Patient has had an injection. Contraception: Depo-Provera injections Last Pap: 04-12-2019. Results were: normal Last mammogram: n/a. Results were: n/a  Obstetric History OB History  Gravida Para Term Preterm AB Living  5 4 4  0 1 4  SAB TAB Ectopic Multiple Live Births  0 1 0 0 4    # Outcome Date GA Lbr Len/2nd Weight Sex Delivery Anes PTL Lv  5 Term 06/26/18 [redacted]w[redacted]d 06:31 / 00:56 6 lb 6.1 oz (2.895 kg) M Vag-Spont EPI  LIV  4 Term 10/25/15 [redacted]w[redacted]d 06:10 / 00:31 6 lb 13.5 oz (3.105 kg) M Vag-Spont EPI  LIV     Birth Comments: WNL  3 Term 07/02/12 [redacted]w[redacted]d 05:37 / 00:20 5 lb 12.4 oz (2.62 kg) F Vag-Spont EPI  LIV  2 TAB 03/12/10          1 Term 02/13/09 [redacted]w[redacted]d  5 lb 4 oz (2.381 kg) F Vag-Spont EPI N LIV    History reviewed. No pertinent past medical history.  History reviewed. No pertinent surgical history.   Current Outpatient Medications:  .  medroxyPROGESTERone Acetate 150 MG/ML SUSY, Inject 1 mL (150 mg total) into the  muscle every 3 (three) months., Disp: 1 mL, Rfl: 0 .  Prenatal Vit w/Fe-Methylfol-FA (PNV PO), Take by mouth., Disp: , Rfl:  .  ibuprofen (ADVIL,MOTRIN) 600 MG tablet, Take 1 tablet (600 mg total) by mouth every 6 (six) hours. (Patient not taking: Reported on 04/22/2020), Disp: 60 tablet, Rfl: 1  Current Facility-Administered Medications:  .  medroxyPROGESTERone (DEPO-PROVERA) injection 150 mg, 150 mg, Intramuscular, Q90 days, 06/22/2020, MD, 150 mg at 03/11/20 1040 No Known Allergies  Social History   Tobacco Use  . Smoking status: Former Smoker    Quit date: 10/2017    Years since quitting: 2.5  . Smokeless tobacco: Never Used  Substance Use Topics  . Alcohol use: Yes    Alcohol/week: 1.0 standard drink    Types: 1 Glasses of wine per week    Comment: none during pregnancy    Family History  Problem Relation Age of Onset  . Breast cancer Mother   . Other Neg Hx       Review of Systems  Constitutional: negative for fatigue and weight loss Respiratory: negative for cough and wheezing Cardiovascular: negative for chest pain, fatigue and palpitations Gastrointestinal: negative for abdominal pain and change in bowel habits Musculoskeletal:negative for myalgias Neurological: negative for gait problems and tremors Behavioral/Psych: negative for abusive relationship, depression Endocrine: negative for temperature intolerance  Genitourinary:negative for abnormal menstrual periods, genital lesions, hot flashes, sexual problems and vaginal discharge Integument/breast: negative for breast lump, breast tenderness, nipple discharge and skin lesion(s)    Objective:       BP 108/78   Pulse 89   Ht 5\' 4"  (1.626 m)   Wt 163 lb 3.2 oz (74 kg)   BMI 28.01 kg/m  General:   alert  Skin:   no rash or abnormalities  Lungs:   clear to auscultation bilaterally  Heart:   regular rate and rhythm, S1, S2 normal, no murmur, click, rub or gallop  Breasts:   normal without suspicious  masses, skin or nipple changes or axillary nodes  Abdomen:  normal findings: no organomegaly, soft, non-tender and no hernia  Pelvis:  External genitalia: normal general appearance Urinary system: urethral meatus normal and bladder without fullness, nontender Vaginal: normal without tenderness, induration or masses Cervix: normal appearance Adnexa: normal bimanual exam Uterus: anteverted and non-tender, normal size   Lab Review Urine pregnancy test Labs reviewed yes Radiologic studies reviewed no  50% of 20 min visit spent on counseling and coordination of care.   Assessment:     1. Encounter for gynecological examination with Papanicolaou smear of cervix Rx: - Cytology - PAP( [place])  2. Vaginal discharge Rx: - Cervicovaginal ancillary only( [place])   Plan:    Education reviewed: calcium supplements, depression evaluation, low fat, low cholesterol diet, safe sex/STD prevention, self breast exams and weight bearing exercise. Contraception: Depo-Provera injections. Follow up in: 1 year.    , MD 04/22/2020 10:34 PM

## 2020-04-25 LAB — CERVICOVAGINAL ANCILLARY ONLY
Chlamydia: NEGATIVE
Comment: NEGATIVE
Comment: NORMAL
Neisseria Gonorrhea: NEGATIVE

## 2020-04-25 LAB — CYTOLOGY - PAP
Comment: NEGATIVE
Diagnosis: NEGATIVE
High risk HPV: NEGATIVE

## 2020-05-25 ENCOUNTER — Other Ambulatory Visit: Payer: Self-pay | Admitting: Obstetrics and Gynecology

## 2020-06-02 ENCOUNTER — Ambulatory Visit: Payer: Medicaid Other

## 2020-06-02 ENCOUNTER — Ambulatory Visit (INDEPENDENT_AMBULATORY_CARE_PROVIDER_SITE_OTHER): Payer: Medicaid Other

## 2020-06-02 ENCOUNTER — Other Ambulatory Visit: Payer: Self-pay

## 2020-06-02 VITALS — BP 111/72 | HR 84

## 2020-06-02 DIAGNOSIS — Z3042 Encounter for surveillance of injectable contraceptive: Secondary | ICD-10-CM | POA: Diagnosis not present

## 2020-06-02 MED ORDER — MEDROXYPROGESTERONE ACETATE 150 MG/ML IM SUSP: 150.0000 mg | INTRAMUSCULAR | 0 refills | Status: DC

## 2020-06-02 NOTE — Progress Notes (Signed)
Patient presented to the office for her depo-provera injection given in left gluteal area.  medroxyPROGESTERone 150 mg  HCG serum: Not indicted  Next depo due around 08/19/2019 Side Effects: none to note

## 2020-08-17 ENCOUNTER — Other Ambulatory Visit: Payer: Self-pay | Admitting: Obstetrics and Gynecology

## 2020-08-20 ENCOUNTER — Other Ambulatory Visit: Payer: Self-pay

## 2020-08-20 MED ORDER — MEDROXYPROGESTERONE ACETATE 150 MG/ML IM SUSP: 150.0000 mg | INTRAMUSCULAR | 2 refills | Status: DC

## 2020-08-21 ENCOUNTER — Other Ambulatory Visit: Payer: Self-pay

## 2020-08-21 ENCOUNTER — Ambulatory Visit (INDEPENDENT_AMBULATORY_CARE_PROVIDER_SITE_OTHER): Payer: Medicaid Other | Admitting: *Deleted

## 2020-08-21 DIAGNOSIS — Z3042 Encounter for surveillance of injectable contraceptive: Secondary | ICD-10-CM | POA: Diagnosis not present

## 2020-08-21 NOTE — Progress Notes (Signed)
Patient was assessed and managed by nursing staff during this encounter. I have reviewed the chart and agree with the documentation and plan. I have also made any necessary editorial changes.  Lawrence A Bass, MD 08/21/2020 5:02 PM   

## 2020-08-21 NOTE — Progress Notes (Signed)
Pt is in office for Depo injection. Pt is on time for injection. Depo given, pt tolerated well.  Pt advised to RTO 4/28-5/12 for next Depo.  Administrations This Visit    medroxyPROGESTERone (DEPO-PROVERA) injection 150 mg    Admin Date 08/21/2020 Action Given Dose 150 mg Route Intramuscular Administered By Lanney Gins, CMA

## 2020-11-10 ENCOUNTER — Ambulatory Visit: Payer: Medicaid Other

## 2020-11-17 ENCOUNTER — Ambulatory Visit (INDEPENDENT_AMBULATORY_CARE_PROVIDER_SITE_OTHER): Payer: Medicaid Other

## 2020-11-17 ENCOUNTER — Other Ambulatory Visit: Payer: Self-pay

## 2020-11-17 VITALS — BP 117/81 | HR 98 | Ht 64.0 in | Wt 154.0 lb

## 2020-11-17 DIAGNOSIS — Z3042 Encounter for surveillance of injectable contraceptive: Secondary | ICD-10-CM

## 2020-11-17 MED ORDER — MEDROXYPROGESTERONE ACETATE 150 MG/ML IM SUSP
150.0000 mg | Freq: Once | INTRAMUSCULAR | Status: AC
  Administered 2020-11-17: 150 mg via INTRAMUSCULAR

## 2020-11-17 NOTE — Progress Notes (Signed)
Pt presents today for Depo Provera injection. Depo Provera 150mg  given IM LUOQ. Pt tolerated well. No adverse side effects noted. Pt to return to clinic July 25- February 16, 2021 for repeat injection. Appointment scheduled upon check out.

## 2021-02-02 ENCOUNTER — Ambulatory Visit: Payer: Medicaid Other

## 2021-02-05 ENCOUNTER — Other Ambulatory Visit: Payer: Self-pay

## 2021-02-05 ENCOUNTER — Ambulatory Visit (INDEPENDENT_AMBULATORY_CARE_PROVIDER_SITE_OTHER): Payer: Medicaid Other

## 2021-02-05 VITALS — BP 112/79 | HR 91 | Ht 64.0 in | Wt 154.0 lb

## 2021-02-05 DIAGNOSIS — Z3042 Encounter for surveillance of injectable contraceptive: Secondary | ICD-10-CM

## 2021-02-05 MED ORDER — MEDROXYPROGESTERONE ACETATE 150 MG/ML IM SUSP
150.0000 mg | Freq: Once | INTRAMUSCULAR | Status: AC
  Administered 2021-02-05: 150 mg via INTRAMUSCULAR

## 2021-02-05 NOTE — Progress Notes (Signed)
Patient was assessed and managed by nursing staff during this encounter. I have reviewed the chart and agree with the documentation and plan. I have also made any necessary editorial changes.  Cherre Robins, CNM 02/05/2021 5:01 PM \

## 2021-02-05 NOTE — Progress Notes (Signed)
SUBJECTIVE: Emma Mcdonald is a 34 y.o. female who presents for DEPO Injection.  OBJECTIVE: Appears well, in no apparent distress.  Vital signs are normal.    ASSESSMENT: within window for DEPO.  Last PAP 04/22/2020  PLAN: DEPO given in RUOQ, tolerated well.  Next DEPO due Oct. 13-27, 2022  Administrations This Visit     medroxyPROGESTERone (DEPO-PROVERA) injection 150 mg     Admin Date 02/05/2021 Action Given Dose 150 mg Route Intramuscular Administered By Maretta Bees, RMA

## 2021-04-27 ENCOUNTER — Ambulatory Visit (INDEPENDENT_AMBULATORY_CARE_PROVIDER_SITE_OTHER): Payer: Medicaid Other

## 2021-04-27 ENCOUNTER — Other Ambulatory Visit: Payer: Self-pay

## 2021-04-27 ENCOUNTER — Ambulatory Visit: Payer: Medicaid Other

## 2021-04-27 DIAGNOSIS — Z3042 Encounter for surveillance of injectable contraceptive: Secondary | ICD-10-CM | POA: Diagnosis not present

## 2021-04-27 NOTE — Progress Notes (Signed)
Pt is in the office for depo injection, administered in [place] and pt tolerated well. Next due Jan 2-16. .. Administrations This Visit     medroxyPROGESTERone (DEPO-PROVERA) injection 150 mg     Admin Date 04/27/2021 Action Given Dose 150 mg Route Intramuscular Administered By Katrina Stack, RN

## 2021-04-28 NOTE — Progress Notes (Signed)
Patient was assessed and managed by nursing staff during this encounter. I have reviewed the chart and agree with the documentation and plan. I have also made any necessary editorial changes.  Catalina Antigua, MD 04/28/2021 11:45 AM

## 2021-06-10 ENCOUNTER — Ambulatory Visit: Payer: Medicaid Other | Admitting: Obstetrics

## 2021-07-17 ENCOUNTER — Ambulatory Visit: Payer: Medicaid Other

## 2021-07-22 ENCOUNTER — Other Ambulatory Visit: Payer: Self-pay

## 2021-07-22 ENCOUNTER — Ambulatory Visit (INDEPENDENT_AMBULATORY_CARE_PROVIDER_SITE_OTHER): Payer: Medicaid Other | Admitting: *Deleted

## 2021-07-22 DIAGNOSIS — Z3042 Encounter for surveillance of injectable contraceptive: Secondary | ICD-10-CM | POA: Diagnosis not present

## 2021-07-22 MED ORDER — MEDROXYPROGESTERONE ACETATE 150 MG/ML IM SUSP
150.0000 mg | Freq: Once | INTRAMUSCULAR | Status: AC
  Administered 2022-01-07: 150 mg via INTRAMUSCULAR

## 2021-07-22 NOTE — Progress Notes (Signed)
Date last pap: 04/22/20 Last Depo-Provera: 04/27/2021 Side Effects if any: None Serum HCG indicated? N/A Depo-Provera 150 mg IM given by: Lillia Abed. Lazarus Salines, RNC in [employer]. Next appointment due: March 29th - April 12th

## 2021-08-24 ENCOUNTER — Ambulatory Visit: Payer: Medicaid Other | Admitting: Family

## 2021-08-31 ENCOUNTER — Other Ambulatory Visit: Payer: Self-pay

## 2021-08-31 ENCOUNTER — Ambulatory Visit (INDEPENDENT_AMBULATORY_CARE_PROVIDER_SITE_OTHER): Payer: Medicaid Other | Admitting: Obstetrics & Gynecology

## 2021-08-31 ENCOUNTER — Encounter: Payer: Self-pay | Admitting: Obstetrics & Gynecology

## 2021-08-31 VITALS — BP 114/76 | HR 96 | Ht 64.0 in | Wt 155.8 lb

## 2021-08-31 DIAGNOSIS — Z803 Family history of malignant neoplasm of breast: Secondary | ICD-10-CM

## 2021-08-31 DIAGNOSIS — Z3042 Encounter for surveillance of injectable contraceptive: Secondary | ICD-10-CM

## 2021-08-31 DIAGNOSIS — Z01419 Encounter for gynecological examination (general) (routine) without abnormal findings: Secondary | ICD-10-CM | POA: Diagnosis not present

## 2021-08-31 NOTE — Progress Notes (Signed)
Patient ID: Emma Mcdonald, female   DOB: 1986/05/16, 36 y.o.   MRN: [id number]  Chief Complaint  Patient presents with   Gynecologic Exam    HPI Emma Mcdonald is a 36 y.o. female.  [id number] No LMP recorded. Patient has had an injection. She receives DMPA Q3 months only seeing occasional spotting. Last pap was normal in 04/2020.  HPI  No past medical history on file.  History reviewed. No pertinent surgical history.  Family History  Problem Relation Age of Onset   Breast cancer Mother    Diabetes Father    Prostate cancer Maternal Grandfather    Breast cancer Paternal Grandmother    Diabetes Paternal Grandfather    Other Neg Hx     Social History Social History   Tobacco Use   Smoking status: Former    Types: Cigarettes    Quit date: 10/2017    Years since quitting: 3.8   Smokeless tobacco: Never  Vaping Use   Vaping Use: Never used  Substance Use Topics   Alcohol use: Yes    Alcohol/week: 2.0 - 3.0 standard drinks    Types: 2 - 3 Glasses of wine per week   Drug use: No    No Known Allergies  Current Outpatient Medications  Medication Sig Dispense Refill   medroxyPROGESTERone Acetate 150 MG/ML SUSY Inject 1 mL (150 mg total) into the muscle every 3 (three) months. 1 mL 0   Current Facility-Administered Medications  Medication Dose Route Frequency Provider Last Rate Last Admin   medroxyPROGESTERone (DEPO-PROVERA) injection 150 mg  150 mg Intramuscular Q90 days Adam Phenix, MD   150 mg at 07/22/21 1402   medroxyPROGESTERone (DEPO-PROVERA) injection 150 mg  150 mg Intramuscular Once Burleson, Terri L, NP        Review of Systems Review of Systems  Constitutional: Negative.   Respiratory: Negative.    Cardiovascular: Negative.   Gastrointestinal: Negative.   Genitourinary: Negative.   Psychiatric/Behavioral: Negative.     Blood pressure 114/76, pulse 96, height 5\' 4"  (1.626 m), weight 155 lb 12.8 oz (70.7 kg).  Physical Exam Physical  Exam Nursing note reviewed. Exam conducted with a chaperone present.  Constitutional:      Appearance: Normal appearance.  Cardiovascular:     Rate and Rhythm: Normal rate and regular rhythm.     Heart sounds: Normal heart sounds.  Pulmonary:     Effort: Pulmonary effort is normal.     Breath sounds: Normal breath sounds.  Chest:  Breasts:    Right: Normal.     Left: Normal.  Musculoskeletal:        General: Normal range of motion.  Lymphadenopathy:     Upper Body:     Right upper body: No axillary adenopathy.     Left upper body: No axillary adenopathy.  Skin:    General: Skin is warm and dry.  Neurological:     Mental Status: She is alert.  Psychiatric:        Mood and Affect: Mood normal.        Behavior: Behavior normal.    Data Reviewed Pap 2021  Assessment Family history of breast cancer in mother  Well woman exam with routine gynecological exam She is doing well, using DMPA  Plan RTC 2 mo for depo provera Annual exams, pap after 04/2023    05/2023 08/31/2021, 1:36 PM

## 2021-08-31 NOTE — Progress Notes (Signed)
Patient presents for AEX. Last pap 04/2020 normal, next due 04/2023. Denies vaginal irritation, odor or discharge. Denies urinary symptoms. Taking Depo, satisfied with method.

## 2021-10-11 ENCOUNTER — Other Ambulatory Visit: Payer: Self-pay | Admitting: Obstetrics

## 2021-10-12 ENCOUNTER — Other Ambulatory Visit: Payer: Self-pay | Admitting: *Deleted

## 2021-10-12 ENCOUNTER — Ambulatory Visit (INDEPENDENT_AMBULATORY_CARE_PROVIDER_SITE_OTHER): Payer: Medicaid Other | Admitting: *Deleted

## 2021-10-12 DIAGNOSIS — Z3042 Encounter for surveillance of injectable contraceptive: Secondary | ICD-10-CM

## 2021-10-12 MED ORDER — MEDROXYPROGESTERONE ACETATE 150 MG/ML IM SUSP
150.0000 mg | Freq: Once | INTRAMUSCULAR | Status: AC
  Administered 2021-10-12: 150 mg via INTRAMUSCULAR

## 2021-10-12 MED ORDER — MEDROXYPROGESTERONE ACETATE 150 MG/ML IM SUSP: 150.0000 mg | INTRAMUSCULAR | 0 refills | Status: DC

## 2021-10-12 MED ORDER — MEDROXYPROGESTERONE ACETATE 150 MG/ML IM SUSP: 150.0000 mg | INTRAMUSCULAR | 3 refills | Status: DC

## 2021-10-12 NOTE — Progress Notes (Signed)
Date last pap: 04/22/20. (Annual 08/31/21) ?Last Depo-Provera: 07/22/21. ?Side Effects if any: NA. ?Serum HCG indicated? NA. ?Depo-Provera 150 mg IM given by: Selena Batten. Rolley Sims, RNC in [place] glut. ?Next appointment due 12/28/21-01/11/22.  ? ?RX sent to pharmacy, not available for pick up. Clinic stock used. Confirmed RX received by pharmacy for future doses. ?

## 2022-01-04 ENCOUNTER — Ambulatory Visit: Payer: Medicaid Other

## 2022-01-04 ENCOUNTER — Other Ambulatory Visit: Payer: Self-pay

## 2022-01-04 ENCOUNTER — Other Ambulatory Visit: Payer: Self-pay | Admitting: Obstetrics

## 2022-01-04 MED ORDER — MEDROXYPROGESTERONE ACETATE 150 MG/ML IM SUSP: 150.0000 mg | INTRAMUSCULAR | 0 refills | Status: DC

## 2022-01-07 ENCOUNTER — Ambulatory Visit (INDEPENDENT_AMBULATORY_CARE_PROVIDER_SITE_OTHER): Payer: Medicaid Other

## 2022-01-07 DIAGNOSIS — Z3042 Encounter for surveillance of injectable contraceptive: Secondary | ICD-10-CM | POA: Diagnosis not present

## 2022-01-07 MED ORDER — MEDROXYPROGESTERONE ACETATE 150 MG/ML IM SUSP: 150.0000 mg | INTRAMUSCULAR | 3 refills | Status: DC

## 2022-01-07 NOTE — Progress Notes (Signed)
Pt is in the office for depo injection, administered in LD and pt tolerated well. Next due Sept 14-28 .Marland Kitchen Administrations This Visit     medroxyPROGESTERone (DEPO-PROVERA) injection 150 mg     Admin Date 01/07/2022 Action Given Dose 150 mg Route Intramuscular Administered By Katrina Stack, RN

## 2022-01-07 NOTE — Progress Notes (Signed)
Agree with nurses's documentation of this patient's clinic encounter.  Kristie Bracewell L, MD  

## 2022-03-29 ENCOUNTER — Encounter: Payer: Self-pay | Admitting: Family Medicine

## 2022-03-29 ENCOUNTER — Ambulatory Visit (INDEPENDENT_AMBULATORY_CARE_PROVIDER_SITE_OTHER): Payer: 59 | Admitting: Family Medicine

## 2022-03-29 ENCOUNTER — Other Ambulatory Visit: Payer: Self-pay | Admitting: Family Medicine

## 2022-03-29 VITALS — BP 118/82 | HR 95 | Temp 98.7°F | Ht 64.0 in | Wt 156.3 lb

## 2022-03-29 DIAGNOSIS — F411 Generalized anxiety disorder: Secondary | ICD-10-CM | POA: Diagnosis not present

## 2022-03-29 DIAGNOSIS — R251 Tremor, unspecified: Secondary | ICD-10-CM

## 2022-03-29 DIAGNOSIS — E538 Deficiency of other specified B group vitamins: Secondary | ICD-10-CM

## 2022-03-29 DIAGNOSIS — F419 Anxiety disorder, unspecified: Secondary | ICD-10-CM | POA: Insufficient documentation

## 2022-03-29 DIAGNOSIS — T5191XA Toxic effect of unspecified alcohol, accidental (unintentional), initial encounter: Secondary | ICD-10-CM | POA: Insufficient documentation

## 2022-03-29 DIAGNOSIS — L299 Pruritus, unspecified: Secondary | ICD-10-CM

## 2022-03-29 DIAGNOSIS — T5194XA Toxic effect of unspecified alcohol, undetermined, initial encounter: Secondary | ICD-10-CM | POA: Diagnosis not present

## 2022-03-29 DIAGNOSIS — E519 Thiamine deficiency, unspecified: Secondary | ICD-10-CM

## 2022-03-29 LAB — COMPREHENSIVE METABOLIC PANEL
ALT: 33 U/L (ref 0–35)
AST: 37 U/L (ref 0–37)
Albumin: 3.9 g/dL (ref 3.5–5.2)
Alkaline Phosphatase: 82 U/L (ref 39–117)
BUN: 7 mg/dL (ref 6–23)
CO2: 25 mEq/L (ref 19–32)
Calcium: 9 mg/dL (ref 8.4–10.5)
Chloride: 104 mEq/L (ref 96–112)
Creatinine, Ser: 0.93 mg/dL (ref 0.40–1.20)
GFR: 80.06 mL/min (ref >60.00)
Glucose, Bld: 91 mg/dL (ref 70–99)
Potassium: 3.6 mEq/L (ref 3.5–5.1)
Sodium: 139 mEq/L (ref 135–145)
Total Bilirubin: 0.6 mg/dL (ref 0.2–1.2)
Total Protein: 7.5 g/dL (ref 6.0–8.3)

## 2022-03-29 LAB — CBC WITH DIFFERENTIAL/PLATELET
Basophils Absolute: 0.1 10*3/uL (ref 0.0–0.1)
Basophils Relative: 1.1 % (ref 0.0–3.0)
Eosinophils Absolute: 0.1 10*3/uL (ref 0.0–0.7)
Eosinophils Relative: 1 % (ref 0.0–5.0)
HCT: 42.4 % (ref 36.0–46.0)
Hemoglobin: 14.4 g/dL (ref 12.0–15.0)
Lymphocytes Relative: 36.1 % (ref 12.0–46.0)
Lymphs Abs: 2 10*3/uL (ref 0.7–4.0)
MCHC: 33.9 g/dL (ref 30.0–36.0)
MCV: 106.3 fl — ABNORMAL HIGH (ref 78.0–100.0)
Monocytes Absolute: 0.4 10*3/uL (ref 0.1–1.0)
Monocytes Relative: 6.9 % (ref 3.0–12.0)
Neutro Abs: 3 10*3/uL (ref 1.4–7.7)
Neutrophils Relative %: 54.9 % (ref 43.0–77.0)
Platelets: 336 10*3/uL (ref 150.0–400.0)
RBC: 3.99 Mil/uL (ref 3.87–5.11)
RDW: 13.6 % (ref 11.5–15.5)
WBC: 5.5 10*3/uL (ref 4.0–10.5)

## 2022-03-29 LAB — B12 AND FOLATE PANEL
Folate: 4.2 ng/mL — ABNORMAL LOW (ref >5.9)
Vitamin B-12: 186 pg/mL — ABNORMAL LOW (ref 211–911)

## 2022-03-29 LAB — TSH: TSH: 1.58 u[IU]/mL (ref 0.35–5.50)

## 2022-03-29 MED ORDER — HYDROXYZINE PAMOATE 25 MG PO CAPS: 25.0000 mg | ORAL_CAPSULE | Freq: Every day | ORAL | 1 refills | Status: DC

## 2022-03-29 MED ORDER — TRIAMCINOLONE ACETONIDE 0.1 % EX CREA: 1.0000 | TOPICAL_CREAM | Freq: Two times a day (BID) | CUTANEOUS | 0 refills | Status: DC

## 2022-03-29 MED ORDER — CITALOPRAM HYDROBROMIDE 20 MG PO TABS: 20.0000 mg | ORAL_TABLET | Freq: Every day | ORAL | 1 refills | Status: DC

## 2022-03-29 NOTE — Assessment & Plan Note (Signed)
Could be due to increased alcohol use, we are checking B vitamin levels, TSH, CBC today to start the evaluation. See below for additional discussion.

## 2022-03-29 NOTE — Assessment & Plan Note (Signed)
This is likely the underlying cause of her increased alcohol consumption. We had a long discussion about therapy and medications, I advised that we try citalopram 20 mg daily at bedtime, along with 25 mg hydroxyzine at bedtime to help her sleep. I advised she start reducing the amount of alcohol she drinks every evening in an effort to stop drinking.

## 2022-03-29 NOTE — Progress Notes (Signed)
New Patient Office Visit  Subjective    Patient ID: Emma Mcdonald, female    DOB: 10/10/1985  Age: 36 y.o. MRN: [id number]  CC:  Chief Complaint  Patient presents with   [place] presents to establish care Patient was only being seen by her OB/GYN and she is having some trouble in the left ear, has been very itchy, going on for 3 months, no hearing loss, no ringing, no sense of fullness or pressure. No fever/chills.   Also would like to receive blood work today. She is reporting having "shaky hands" she was also told by her husband and sister that they noticed that her hand shakes occasionally, states that more recently she has been consuming more alcohol, thinks this might be related. States she is drinking a whole bottle of wine and 2 additional alcoholic drinks per day. States that her mother passed away in Oct 27, 2015, then with COVID and having kids she feels more overwhelmed and over-stimulated, more stress/ pressure at home. Occasional feelings of sadness/ hopelessness, states she feels like "she needs a break".   Outpatient Encounter Medications as of 03/29/2022  Medication Sig   medroxyPROGESTERone (DEPO-PROVERA) 150 MG/ML injection Inject 1 mL (150 mg total) into the muscle every 3 (three) months.   [DISCONTINUED] medroxyPROGESTERone (DEPO-PROVERA) 150 MG/ML injection Inject 1 mL (150 mg total) into the muscle every 3 (three) months. (Patient not taking: Reported on 03/29/2022)   Facility-Administered Encounter Medications as of 03/29/2022  Medication   medroxyPROGESTERone (DEPO-PROVERA) injection 150 mg    History reviewed. No pertinent past medical history.  Past Surgical History:  Procedure Laterality Date   TONGUE SURGERY      Family History  Problem Relation Age of Onset   Breast cancer Mother    Cancer - Other Mother    Diabetes Father    Prostate cancer Maternal Grandfather    Breast cancer Paternal Grandmother     Diabetes Paternal Grandfather    Other Neg Hx     Social History   Socioeconomic History   Marital status: Married    Spouse name: Not on file   Number of children: Not on file   Years of education: Not on file   Highest education level: Not on file  Occupational History   Not on file  Tobacco Use   Smoking status: Former    Types: Cigarettes    Quit date: October 26, 2017    Years since quitting: 4.4   Smokeless tobacco: Never  Vaping Use   Vaping Use: Never used  Substance and Sexual Activity   Alcohol use: Yes    Alcohol/week: 2.0 - 3.0 standard drinks of alcohol    Types: 2 - 3 Glasses of wine per week   Drug use: Not Currently   Sexual activity: Yes    Partners: Male    Birth control/protection: Injection  Other Topics Concern   Not on file  Social History Narrative   Not on file   Social Determinants of Health   Financial Resource Strain: Low Risk  (06/26/2018)   Overall Financial Resource Strain (CARDIA)    Difficulty of Paying Living Expenses: Not hard at all  Food Insecurity: Unknown (06/26/2018)   Hunger Vital Sign    Worried About Running Out of Food in the Last Year: Patient refused    [place] in the Last Year: Patient refused  Transportation Needs: Unknown (06/26/2018)   [place] - Transportation  Lack of Transportation (Medical): Patient refused    Lack of Transportation (Non-Medical): Patient refused  Physical Activity: Unknown (06/26/2018)   Exercise Vital Sign    Days of Exercise per Week: Patient refused    Minutes of Exercise per Session: Patient refused  Stress: Not on file  Social Connections: Unknown (06/26/2018)   Social Connection and Isolation Panel [NHANES]    Frequency of Communication with Friends and Family: Patient refused    Frequency of Social Gatherings with Friends and Family: Patient refused    Attends Religious Services: Patient refused    Active Member of Clubs or Organizations: Patient refused    Attends Theatre manager Meetings: Patient refused    Marital Status: Patient refused  Intimate Partner Violence: Unknown (06/26/2018)   Humiliation, Afraid, Rape, and Kick questionnaire    Fear of Current or Ex-Partner: Patient refused    Emotionally Abused: Patient refused    Physically Abused: Patient refused    Sexually Abused: Patient refused    Review of Systems  Constitutional:  Negative for fever.  HENT:  Negative for ear discharge, ear pain and hearing loss.   Neurological:  Positive for tremors. Negative for dizziness, sensory change and headaches.  Psychiatric/Behavioral:  The patient is nervous/anxious and has insomnia.   All other systems reviewed and are negative.       Objective    BP 118/82 (BP Location: Left Arm, Patient Position: Sitting, Cuff Size: Normal)   Pulse 95   Temp 98.7 F (37.1 C) (Oral)   Ht 5\' 4"  (1.626 m)   Wt 156 lb 4.8 oz (70.9 kg)   SpO2 96%   BMI 26.83 kg/m   Physical Exam Vitals reviewed.  Constitutional:      Appearance: Normal appearance. She is well-groomed and normal weight.  HENT:     Right Ear: Tympanic membrane, ear canal and external ear normal.     Left Ear: Tympanic membrane, ear canal and external ear normal.  Eyes:     Extraocular Movements: Extraocular movements intact.     Conjunctiva/sclera: Conjunctivae normal.  Cardiovascular:     Rate and Rhythm: Normal rate and regular rhythm.     Pulses: Normal pulses.     Heart sounds: S1 normal and S2 normal.  Pulmonary:     Effort: Pulmonary effort is normal.     Breath sounds: Normal breath sounds and air entry.  Abdominal:     General: Abdomen is flat. Bowel sounds are normal.  Musculoskeletal:        General: Normal range of motion.     Cervical back: Normal range of motion and neck supple.     Right lower leg: No edema.     Left lower leg: No edema.  Skin:    General: Skin is warm and dry.  Neurological:     Mental Status: She is alert and oriented to person, place, and  time. Mental status is at baseline.     Gait: Gait is intact.  Psychiatric:        Mood and Affect: Mood normal. Affect is tearful.        Speech: Speech normal.        Behavior: Behavior normal.        Judgment: Judgment normal.         Assessment & Plan:   Problem List Items Addressed This Visit       Other   Tremor    Could be due to increased alcohol use, we  are checking B vitamin levels, TSH, CBC today to start the evaluation. See below for additional discussion.      Relevant Orders   TSH (Completed)   CBC with Differential/Platelets (Completed)   B12 and Folate Panel (Completed)   Vitamin B1   Alcohol causing toxic effect    Pt drinking increased, now daily drinking a larger amount than normal, the tremor could be due to this increased consumption, I recommend checking CMP to evaluate liver function.       Relevant Orders   Vitamin B1   CMP (Completed)   Anxiety disorder    This is likely the underlying cause of her increased alcohol consumption. We had a long discussion about therapy and medications, I advised that we try citalopram 20 mg daily at bedtime, along with 25 mg hydroxyzine at bedtime to help her sleep. I advised she start reducing the amount of alcohol she drinks every evening in an effort to stop drinking.       Relevant Medications   citalopram (CELEXA) 20 MG tablet   hydrOXYzine (VISTARIL) 25 MG capsule   Pruritus of external auditory canal   Relevant Medications   triamcinolone cream (KENALOG) 0.1 %     Return in about 8 weeks (around 05/24/2022) for Video visit for follow up of anxiety.   Farrel Conners, MD

## 2022-03-29 NOTE — Assessment & Plan Note (Signed)
Pt drinking increased, now daily drinking a larger amount than normal, the tremor could be due to this increased consumption, I recommend checking CMP to evaluate liver function.

## 2022-03-30 MED ORDER — FOLIC ACID 1 MG PO TABS: 1.0000 mg | ORAL_TABLET | Freq: Every day | ORAL | 3 refills | Status: DC

## 2022-03-30 MED ORDER — VITAMIN B-12 1000 MCG PO TABS: 1000.0000 ug | ORAL_TABLET | Freq: Every day | ORAL | 3 refills | Status: DC

## 2022-04-05 ENCOUNTER — Other Ambulatory Visit: Payer: Self-pay | Admitting: Obstetrics

## 2022-04-05 ENCOUNTER — Ambulatory Visit: Payer: Medicaid Other

## 2022-04-06 LAB — VITAMIN B1: Vitamin B1 (Thiamine): <6 nmol/L — ABNORMAL LOW (ref 8–30)

## 2022-04-07 MED ORDER — THIAMINE HCL 100 MG PO TABS: 100.0000 mg | ORAL_TABLET | Freq: Every day | ORAL | 5 refills | Status: DC

## 2022-04-07 NOTE — Addendum Note (Signed)
Addended by: Farrel Conners on: 04/07/2022 12:25 PM   Modules accepted: Orders

## 2022-04-08 ENCOUNTER — Ambulatory Visit (INDEPENDENT_AMBULATORY_CARE_PROVIDER_SITE_OTHER): Payer: 59 | Admitting: *Deleted

## 2022-04-08 VITALS — BP 120/86 | HR 85

## 2022-04-08 DIAGNOSIS — Z3042 Encounter for surveillance of injectable contraceptive: Secondary | ICD-10-CM | POA: Diagnosis not present

## 2022-04-08 MED ORDER — MEDROXYPROGESTERONE ACETATE 150 MG/ML IM SUSP
150.0000 mg | Freq: Once | INTRAMUSCULAR | Status: AC
  Administered 2022-04-08: 150 mg via INTRAMUSCULAR

## 2022-04-08 MED ORDER — MEDROXYPROGESTERONE ACETATE 150 MG/ML IM SUSP: 150.0000 mg | INTRAMUSCULAR | 1 refills | Status: DC

## 2022-04-08 NOTE — Progress Notes (Signed)
Date last pap: 04/22/20 (annual 08/31/21). Last Depo-Provera: 01/07/22. Side Effects if any: NA. Serum HCG indicated? NA. Depo-Provera 150 mg IM given by: Lillia Abed. Lazarus Salines, RNC in [place]. Next appointment due 06/24/22-07/08/22.

## 2022-04-09 NOTE — Progress Notes (Signed)
Patient was assessed and managed by nursing staff during this encounter. I have reviewed the chart and agree with the documentation and plan. I have also made any necessary editorial changes.  Yordy Matton A Drako Maese, MD 04/09/2022 9:25 AM   

## 2022-05-18 ENCOUNTER — Ambulatory Visit: Payer: 59 | Admitting: Advanced Practice Midwife

## 2022-05-19 ENCOUNTER — Ambulatory Visit: Payer: 59 | Admitting: Student

## 2022-05-25 ENCOUNTER — Encounter: Payer: 59 | Admitting: Family Medicine

## 2022-05-28 ENCOUNTER — Encounter: Payer: Self-pay | Admitting: Family Medicine

## 2022-05-28 ENCOUNTER — Telehealth (INDEPENDENT_AMBULATORY_CARE_PROVIDER_SITE_OTHER): Payer: 59 | Admitting: Family Medicine

## 2022-05-28 DIAGNOSIS — F411 Generalized anxiety disorder: Secondary | ICD-10-CM | POA: Diagnosis not present

## 2022-05-28 DIAGNOSIS — F5104 Psychophysiologic insomnia: Secondary | ICD-10-CM

## 2022-05-28 MED ORDER — TRAZODONE HCL 50 MG PO TABS: 25.0000 mg | ORAL_TABLET | Freq: Every evening | ORAL | 1 refills | Status: DC | PRN

## 2022-05-28 NOTE — Progress Notes (Unsigned)
Established Patient Office Visit  Subjective   Patient ID: Emma Mcdonald, female    DOB: 1986/01/07  Age: 36 y.o. MRN: [id number]  Chief Complaint  Patient presents with   Follow-up   I connected with  Leathia Little Genrich on 05/31/22 by a video enabled telemedicine application and verified that I am speaking with the correct person using two identifiers.   I discussed the limitations of evaluation and management by telemedicine. The patient expressed understanding and agreed to proceed.   Patient is being seen via video visit today for follow up of her anxiety disorder and chronic insomnia.  Patient reports that she thinks that the citalopram is working better for her. States that the hydroxyzine is helping her sleep however she having an increase in vaginal dryness and she feels groggy when she wakes up in the morning.      Current Outpatient Medications  Medication Instructions   citalopram (CELEXA) 20 mg, Oral, Daily at bedtime   cyanocobalamin (VITAMIN B12) 1,000 mcg, Oral, Daily   folic acid (FOLVITE) 1 mg, Oral, Daily   medroxyPROGESTERone (DEPO-PROVERA) 150 mg, Intramuscular, Every 3 months   medroxyPROGESTERone (DEPO-PROVERA) 150 mg, Intramuscular, Every 3 months   thiamine (VITAMIN B1) 100 mg, Oral, Daily   traZODone (DESYREL) 25 mg, Oral, At bedtime PRN   triamcinolone cream (KENALOG) 0.1 % APPLY TO AFFECTED AREA TWICE A DAY    Patient Active Problem List   Diagnosis Date Noted   Tremor 03/29/2022   Alcohol causing toxic effect 03/29/2022   Anxiety disorder 03/29/2022   Pruritus of external auditory canal 03/29/2022   Family history of breast cancer in mother 08/02/2018      Review of Systems  All other systems reviewed and are negative.     Objective:     There were no vitals taken for this visit.   Physical Exam Constitutional:      Appearance: Normal appearance. She is normal weight.  Eyes:     Conjunctiva/sclera: Conjunctivae  normal.  Pulmonary:     Effort: Pulmonary effort is normal.  Neurological:     Mental Status: She is alert and oriented to person, place, and time. Mental status is at baseline.  Psychiatric:        Attention and Perception: Attention normal.        Mood and Affect: Mood and affect normal.        Thought Content: Thought content normal.        Judgment: Judgment normal.      No results found for any visits on 05/28/22.    The ASCVD Risk score (Arnett DK, et al., 2019) failed to calculate for the following reasons:   The 2019 ASCVD risk score is only valid for ages 64 to 48    Assessment & Plan:   Problem List Items Addressed This Visit       Unprioritized   Anxiety disorder - Primary    Citalopram is helping her anxiety symptoms, however she continues to drink about 1 bottle of wine per night in order to help her sleep. I recommended switching her from the amitriptyline to trazodone 25 -50 mg daily at bedtime to help wither sleep. Pt states she wouldn't need to drink alcohol if she could get to sleep.  I will have her return to office in 3 months for follow up.       Relevant Medications   traZODone (DESYREL) 50 MG tablet   Other Visit Diagnoses  Chronic insomnia       Relevant Medications   traZODone (DESYREL) 50 MG tablet      I spent 20 minutes in the face to face video encounter with the patient. Return in about 3 months (around 08/28/2022) for please reschedule her annual physical for the 3 month follow up.    Emma Georges, MD

## 2022-05-31 NOTE — Assessment & Plan Note (Signed)
Citalopram is helping her anxiety symptoms, however she continues to drink about 1 bottle of wine per night in order to help her sleep. I recommended switching her from the amitriptyline to trazodone 25 -50 mg daily at bedtime to help wither sleep. Pt states she wouldn't need to drink alcohol if she could get to sleep.  I will have her return to office in 3 months for follow up.

## 2022-07-01 ENCOUNTER — Ambulatory Visit: Payer: 59

## 2022-07-01 ENCOUNTER — Telehealth: Payer: Self-pay | Admitting: Family Medicine

## 2022-07-01 NOTE — Telephone Encounter (Signed)
Yes ok to order the deposhots, if there has been a lapse in her injections she will need a pregnanacy test before getting the shot.

## 2022-07-01 NOTE — Telephone Encounter (Signed)
Pt called to ask if MD is going to put in an order for Pt to start getting her Depo shots here, and she would also like to know when is the soonest she can come in for a shot?  Please advise.

## 2022-07-02 NOTE — Telephone Encounter (Signed)
Spoke with the patient and informed her of the message below.  Patient stated there has not been a lapse as she is due for the shot now and does not want to go back to the previous office.  Patient was advised our office is closing at 12pm today and will not re-open until Tuesday.  Patient stated she is going out of town and will call back for an appt to receive the injection here.

## 2022-08-23 ENCOUNTER — Ambulatory Visit (INDEPENDENT_AMBULATORY_CARE_PROVIDER_SITE_OTHER): Payer: 59 | Admitting: Family Medicine

## 2022-08-23 ENCOUNTER — Encounter: Payer: Self-pay | Admitting: Family Medicine

## 2022-08-23 VITALS — BP 108/80 | HR 93 | Temp 98.1°F | Ht 64.0 in | Wt 147.7 lb

## 2022-08-23 DIAGNOSIS — E538 Deficiency of other specified B group vitamins: Secondary | ICD-10-CM | POA: Diagnosis not present

## 2022-08-23 DIAGNOSIS — F411 Generalized anxiety disorder: Secondary | ICD-10-CM

## 2022-08-23 DIAGNOSIS — H532 Diplopia: Secondary | ICD-10-CM | POA: Diagnosis not present

## 2022-08-23 DIAGNOSIS — E519 Thiamine deficiency, unspecified: Secondary | ICD-10-CM | POA: Diagnosis not present

## 2022-08-23 MED ORDER — CITALOPRAM HYDROBROMIDE 20 MG PO TABS: 20.0000 mg | ORAL_TABLET | Freq: Every day | ORAL | 1 refills | Status: DC

## 2022-08-23 NOTE — Progress Notes (Unsigned)
Acute Office Visit  Subjective:     Patient ID: Emma Mcdonald, female    DOB: [birth year], 37 y.o.   MRN: [id number]  Chief Complaint  Patient presents with   Diplopia    X1.5 months, states she was seen by Dr Claiborne Billings (eye doctor at [facility]) today and diagnosed with severe eye strain    Anxiety-- pt is on citalopram 20 mg daily, states she stopped the amitriptyline and she has not started the    Patient is in today for diplopia for about 1 1/2 months. States that it comes and goes. States that she has episodes of this, states it happens very suddenly, lasts for at least 10 minutes. States it is happening every day. States she saw the optometrist and had a dilated eye exam, was told that her retinas were ok, no signs of diabetes in the eye or other issues. States he did recommend glasses and she will be getting these soon. No other associated symptoms, no headaches, no vertigo, no loss of vision. Husband states that her left eye is drooping a little bit. Not sure if it gets worse with tiredness.   Review of Systems  All other systems reviewed and are negative.       Objective:    BP 108/80 (BP Location: Left Arm, Patient Position: Sitting, Cuff Size: Normal)   Pulse 93   Temp 98.1 F (36.7 C) (Oral)   Ht 5' 4"$  (1.626 m)   Wt 147 lb 11.2 oz (67 kg)   SpO2 98%   BMI 25.35 kg/m  {Vitals History (Optional):23777}  Physical Exam Vitals reviewed.  Constitutional:      Appearance: Normal appearance. She is well-groomed and normal weight.  Eyes:     Conjunctiva/sclera: Conjunctivae normal.  Neck:     Thyroid: No thyromegaly.  Cardiovascular:     Rate and Rhythm: Normal rate and regular rhythm.     Pulses: Normal pulses.     Heart sounds: S1 normal and S2 normal.  Pulmonary:     Effort: Pulmonary effort is normal.     Breath sounds: Normal breath sounds and air entry.  Abdominal:     General: Bowel sounds are normal.  Musculoskeletal:     Right lower leg: No  edema.     Left lower leg: No edema.  Neurological:     Mental Status: She is alert and oriented to person, place, and time. Mental status is at baseline.     Gait: Gait is intact.  Psychiatric:        Mood and Affect: Mood and affect normal.        Speech: Speech normal.        Behavior: Behavior normal.        Judgment: Judgment normal.     No results found for any visits on 08/23/22.      Assessment & Plan:   Problem List Items Addressed This Visit       Unprioritized   Anxiety disorder   Relevant Orders   Ambulatory referral to Psychology   Other Visit Diagnoses     B12 deficiency    -  Primary   Relevant Orders   B12 and Folate Panel   Folate deficiency       Relevant Orders   B12 and Folate Panel   Thiamine deficiency       Relevant Orders   Vitamin B1   Diplopia       Relevant Orders   Ambulatory  referral to Ophthalmology       No orders of the defined types were placed in this encounter.   Return in about 35 weeks (around 04/25/2023) for annual physical with pap smear.  Farrel Conners, MD

## 2022-08-24 LAB — B12 AND FOLATE PANEL
Folate: 3.7 ng/mL — ABNORMAL LOW (ref >5.9)
Vitamin B-12: 364 pg/mL (ref 211–911)

## 2022-08-24 NOTE — Assessment & Plan Note (Signed)
Patient is requesting referral to a therapist, orders placed. Her anxiety is about the same as last visit. Pt states she stopped citalopram after the last visit, however I reviewed the note and I had advised she stop the amitriptyline not the citalopram. I will clarify this with the patient.

## 2022-08-27 LAB — VITAMIN B1: Vitamin B1 (Thiamine): 14 nmol/L (ref 8–30)

## 2022-09-07 ENCOUNTER — Ambulatory Visit: Payer: 59 | Admitting: Psychology

## 2022-09-09 ENCOUNTER — Telehealth: Payer: Self-pay | Admitting: Family Medicine

## 2022-09-09 DIAGNOSIS — H532 Diplopia: Secondary | ICD-10-CM

## 2022-09-09 NOTE — Telephone Encounter (Signed)
Pt called to say the Ophthalmology location for her referral told her they are  booked out until August 2024.  Ophthalmology location gave Pt the name of a location that has sooner availability, but a new referral would have to be sent to them.   [place] [phone]  Please advise.

## 2022-09-10 NOTE — Telephone Encounter (Signed)
Left a detailed message at the patient's cell number stating a new referral was entered and someone will contact her with appt info.

## 2022-09-14 NOTE — Telephone Encounter (Signed)
Sent referral to [facility].

## 2022-09-30 ENCOUNTER — Ambulatory Visit (INDEPENDENT_AMBULATORY_CARE_PROVIDER_SITE_OTHER): Payer: 59 | Admitting: Psychology

## 2022-09-30 DIAGNOSIS — F4321 Adjustment disorder with depressed mood: Secondary | ICD-10-CM | POA: Diagnosis not present

## 2022-09-30 DIAGNOSIS — F1010 Alcohol abuse, uncomplicated: Secondary | ICD-10-CM | POA: Diagnosis not present

## 2022-09-30 NOTE — Progress Notes (Addendum)
[place] Counselor Initial Adult Exam  Name: Emma Mcdonald Date: 09/30/2022 MRN: [id number] DOB: 04-Oct-1985 PCP: Farrel Conners, MD  Time spent: 45 mins  Guardian/Payee:  Pt   Paperwork requested: No   Reason for Visit /Presenting Problem: Pt presents via WebEx video for today's session.  Pt shares that she is in her home with no one present in the room with her.  I shared with her that I am in my office with no one else here either.  Mental Status Exam: Appearance:   Casual     Behavior:  Appropriate  Motor:  Normal  Speech/Language:   Clear and Coherent  Affect:  Appropriate  Mood:  depressed  Thought process:  normal  Thought content:    WNL  Sensory/Perceptual disturbances:    WNL  Orientation:  oriented to person, place, and time/date  Attention:  Good  Concentration:  Good  Memory:  WNL  Fund of knowledge:   Good  Insight:    Good  Judgment:   Good  Impulse Control:  Good   Reported Symptoms:  Married, Mother of 4 kids (  ); her mom passed away in 2015/11/09; Pt "had a falling out with my father's girlfriend and that affected my relationship with my dad."  Risk Assessment: Danger to Self:  No Self-injurious Behavior: No Danger to Others: No Duty to Warn:no Physical Aggression / Violence:No  Access to Firearms a concern: No  Gang Involvement:No  Patient / guardian was educated about steps to take if suicide or homicide risk level increases between visits: n/a While future psychiatric events cannot be accurately predicted, the patient does not currently require acute inpatient psychiatric care and does not currently meet [facility] involuntary commitment criteria.  Substance Abuse History: Current substance abuse: Yes ; drinks only in the evening; shakes during the day; Pt has not driven in the past month; drinks because she is anxious; pt started drinking at 37 yo; started over using alcohol at about 37 yo.    Past Psychiatric  History:   No previous psychological problems have been observed Outpatient Providers:none History of Psych Hospitalization: No  Psychological Testing:  none    Abuse History:  Victim of: No.,  none    Report needed: No. Victim of Neglect:No. Perpetrator of  none   Witness / Exposure to Domestic Violence: No   Protective Services Involvement: No  Witness to [employer] Violence:  No   Family History:  Family History  Problem Relation Age of Onset   Breast cancer Mother    Cancer - Other Mother    Diabetes Father    Prostate cancer Maternal Grandfather    Breast cancer Paternal Grandmother    Diabetes Paternal Grandfather    Other Neg Hx     Living situation: the patient lives with their family  Sexual Orientation: Straight  Relationship Status: married for almost 10 yrs; together since 11/08/2008. 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  I did take a girls trip last year and that was great."    Stressors: Loss of mother in 2017; strain with her father; "never ending mommy duties.   Substance abuse    Strengths: Supportive Relationships, Friends, and husband  Barriers:  none noted   Legal History: Pending legal issue / charges: The patient has no significant history of legal issues. History of legal issue /  charges:  none  Medical History/Surgical History: reviewed No past medical history on file.  Past Surgical History:  Procedure Laterality Date   TONGUE SURGERY      Medications: Current Outpatient Medications  Medication Sig Dispense Refill   citalopram (CELEXA) 20 MG tablet Take 1 tablet (20 mg total) by mouth at bedtime. 90 tablet 1   cyanocobalamin (VITAMIN B12) 1000 MCG tablet Take 1 tablet (1,000 mcg total) by mouth daily. 90 tablet 3   folic acid (FOLVITE) 1 MG tablet Take 1 tablet (1 mg total) by mouth daily. 90 tablet 3   thiamine (VITAMIN B1) 100 MG tablet Take 1 tablet (100 mg total) by mouth daily. 30 tablet 5   traZODone (DESYREL) 50 MG tablet Take 0.5 tablets (25 mg total) by mouth at bedtime as needed for sleep. 45 tablet 1   Current Facility-Administered Medications  Medication Dose Route Frequency Provider Last Rate Last Admin   medroxyPROGESTERone (DEPO-PROVERA) injection 150 mg  150 mg Intramuscular Q90 days Woodroe Mode, MD   150 mg at 07/22/21 1402    No Known Allergies  Diagnoses:  Alcohol abuse  Adjustment disorder with depressed mood  Plan of Care: Pt shares she is planning to attempt to decrease her alcohol use next week since the kids will not be in school and she will not have to get up so early for school.  Encouraged pt to think about what self care activities she wants to start engaging until we meet next week for our follow up session (10/07/22).   Ivan Anchors, [facility]

## 2022-10-01 ENCOUNTER — Telehealth: Payer: Self-pay | Admitting: Family Medicine

## 2022-10-01 NOTE — Telephone Encounter (Signed)
Left a detailed message at the patient's cell number stating she should go to an urgent care close to her home, and there are a number of [facility] locally for evaluation as soon as possible due to the symptoms below as it is important to be evaluated even though she is not pregnant.

## 2022-10-01 NOTE — Telephone Encounter (Signed)
Pt c/o not keeping food down been drinking liquids. She throws everything up and stated she took a preg test but she is not pregnant.   Please advise.

## 2022-10-07 ENCOUNTER — Ambulatory Visit: Payer: 59 | Admitting: Psychology

## 2022-10-13 ENCOUNTER — Telehealth: Payer: Self-pay | Admitting: Family Medicine

## 2022-10-13 NOTE — Telephone Encounter (Signed)
Pt needs to reschedule her New patient appointment.  She must be seen before her physical appointment that she has scheduled in October.

## 2022-10-29 ENCOUNTER — Ambulatory Visit: Payer: 59 | Admitting: Family Medicine

## 2022-11-04 ENCOUNTER — Ambulatory Visit: Payer: 59 | Admitting: Psychology

## 2022-11-13 ENCOUNTER — Emergency Department (HOSPITAL_BASED_OUTPATIENT_CLINIC_OR_DEPARTMENT_OTHER)
Admission: EM | Admit: 2022-11-13 | Discharge: 2022-11-14 | Disposition: A | Discharge status: Home / Self Care | Payer: 59 | Attending: Emergency Medicine | Admitting: Emergency Medicine

## 2022-11-13 ENCOUNTER — Other Ambulatory Visit: Payer: Self-pay

## 2022-11-13 ENCOUNTER — Encounter (HOSPITAL_BASED_OUTPATIENT_CLINIC_OR_DEPARTMENT_OTHER): Payer: Self-pay | Admitting: Emergency Medicine

## 2022-11-13 DIAGNOSIS — Z87891 Personal history of nicotine dependence: Secondary | ICD-10-CM | POA: Insufficient documentation

## 2022-11-13 DIAGNOSIS — R109 Unspecified abdominal pain: Secondary | ICD-10-CM

## 2022-11-13 DIAGNOSIS — R1032 Left lower quadrant pain: Secondary | ICD-10-CM | POA: Diagnosis not present

## 2022-11-13 LAB — CBC WITH DIFFERENTIAL/PLATELET
Abs Immature Granulocytes: 0.01 10*3/uL (ref 0.00–0.07)
Basophils Absolute: 0.1 10*3/uL (ref 0.0–0.1)
Basophils Relative: 1 %
Eosinophils Absolute: 0.1 10*3/uL (ref 0.0–0.5)
Eosinophils Relative: 1 %
HCT: 47 % — ABNORMAL HIGH (ref 36.0–46.0)
Hemoglobin: 15.9 g/dL — ABNORMAL HIGH (ref 12.0–15.0)
Immature Granulocytes: 0 %
Lymphocytes Relative: 38 %
Lymphs Abs: 2.4 10*3/uL (ref 0.7–4.0)
MCH: 36.2 pg — ABNORMAL HIGH (ref 26.0–34.0)
MCHC: 33.8 g/dL (ref 30.0–36.0)
MCV: 107.1 fL — ABNORMAL HIGH (ref 80.0–100.0)
Monocytes Absolute: 0.5 10*3/uL (ref 0.1–1.0)
Monocytes Relative: 7 %
Neutro Abs: 3.3 10*3/uL (ref 1.7–7.7)
Neutrophils Relative %: 53 %
Platelets: 319 10*3/uL (ref 150–400)
RBC: 4.39 MIL/uL (ref 3.87–5.11)
RDW: 12.8 % (ref 11.5–15.5)
WBC: 6.3 10*3/uL (ref 4.0–10.5)
nRBC: 0 % (ref 0.0–0.2)

## 2022-11-13 LAB — URINALYSIS, ROUTINE W REFLEX MICROSCOPIC
Bacteria, UA: NONE SEEN
Bilirubin Urine: NEGATIVE
Glucose, UA: NEGATIVE mg/dL
Ketones, ur: 15 mg/dL — AB
Nitrite: NEGATIVE
Specific Gravity, Urine: 1.024 (ref 1.005–1.030)
pH: 6 (ref 5.0–8.0)

## 2022-11-13 LAB — PREGNANCY, URINE: Preg Test, Ur: NEGATIVE

## 2022-11-13 NOTE — ED Triage Notes (Signed)
Mid back/ left flank pain started last week. Seen at UC told to seek eval. + blood in urine (in epic)

## 2022-11-13 NOTE — ED Provider Notes (Signed)
[facility] Provider Note: Emma Dell, MD, FACEP  CSN: [id number] MRN: [id number] ARRIVAL: 11/13/22 at 2113 ROOM: DB016/DB016   CHIEF COMPLAINT  Back Pain   HISTORY OF PRESENT ILLNESS  11/13/22 11:24 PM Emma Mcdonald is a 37 y.o. female with left flank pain for the past week.  She rates the pain as an 8 out of 10.  It is worse with movements.  She was seen at an urgent care and was noted to have blood in her urine on dipstick.  She was sent here for further evaluation under suspicion of a kidney stone.   History reviewed. No pertinent past medical history.  Past Surgical History:  Procedure Laterality Date   TONGUE SURGERY      Family History  Problem Relation Age of Onset   Breast cancer Mother    Cancer - Other Mother    Diabetes Father    Prostate cancer Maternal Grandfather    Breast cancer Paternal Grandmother    Diabetes Paternal Grandfather    Other Neg Hx     Social History   Tobacco Use   Smoking status: Former    Types: Cigarettes    Quit date: 10/2017    Years since quitting: 5.0   Smokeless tobacco: Never  Vaping Use   Vaping Use: Never used  Substance Use Topics   Alcohol use: Yes    Alcohol/week: 2.0 - 3.0 standard drinks of alcohol    Types: 2 - 3 Glasses of wine per week   Drug use: Not Currently    Prior to Admission medications   Medication Sig Start Date End Date Taking? Authorizing Provider  citalopram (CELEXA) 20 MG tablet Take 1 tablet (20 mg total) by mouth at bedtime. 08/23/22   Karie Georges, MD  cyanocobalamin (VITAMIN B12) 1000 MCG tablet Take 1 tablet (1,000 mcg total) by mouth daily. 03/30/22   Karie Georges, MD  folic acid (FOLVITE) 1 MG tablet Take 1 tablet (1 mg total) by mouth daily. 03/30/22   Karie Georges, MD  thiamine (VITAMIN B1) 100 MG tablet Take 1 tablet (100 mg total) by mouth daily. 04/07/22   Karie Georges, MD  traZODone (DESYREL) 50 MG tablet Take 0.5 tablets (25 mg total) by  mouth at bedtime as needed for sleep. 05/28/22   Karie Georges, MD    Allergies Patient has no known allergies.   REVIEW OF SYSTEMS  Negative except as noted here or in the History of Present Illness.   PHYSICAL EXAMINATION  Initial Vital Signs Blood pressure (!) 131/103, pulse 85, temperature 98 F (36.7 C), temperature source Oral, resp. rate 18, last menstrual period 11/03/2022, SpO2 100 %.  Examination General: Well-developed, well-nourished female in no acute distress; appearance consistent with age of record HENT: normocephalic; atraumatic Eyes: Normal appearance Neck: supple Heart: regular rate and rhythm Lungs: clear to auscultation bilaterally Abdomen: soft; nondistended; nontender; bowel sounds present Back: Left posterior lateral rib tenderness without rash Extremities: No deformity; full range of motion; pulses normal Neurologic: Awake, alert and oriented; motor function intact in all extremities and symmetric; no facial droop Skin: Warm and dry Psychiatric: Normal mood and affect   RESULTS  Summary of this visit's results, reviewed and interpreted by myself:   EKG Interpretation  Date/Time:    Ventricular Rate:    PR Interval:    QRS Duration:   QT Interval:    QTC Calculation:   R Axis:     Text Interpretation:  Laboratory Studies: Results for orders placed or performed during the hospital encounter of 11/13/22 (from the past 24 hour(s))  CBC with Differential     Status: Abnormal   Collection Time: 11/13/22  9:43 PM  Result Value Ref Range   WBC 6.3 4.0 - 10.5 K/uL   RBC 4.39 3.87 - 5.11 MIL/uL   Hemoglobin 15.9 (H) 12.0 - 15.0 g/dL   HCT 16.1 (H) 09.6 - 04.5 %   MCV 107.1 (H) 80.0 - 100.0 fL   MCH 36.2 (H) 26.0 - 34.0 pg   MCHC 33.8 30.0 - 36.0 g/dL   RDW 40.9 81.1 - 91.4 %   Platelets 319 150 - 400 K/uL   nRBC 0.0 0.0 - 0.2 %   Neutrophils Relative % 53 %   Neutro Abs 3.3 1.7 - 7.7 K/uL   Lymphocytes Relative 38 %   Lymphs  Abs 2.4 0.7 - 4.0 K/uL   Monocytes Relative 7 %   Monocytes Absolute 0.5 0.1 - 1.0 K/uL   Eosinophils Relative 1 %   Eosinophils Absolute 0.1 0.0 - 0.5 K/uL   Basophils Relative 1 %   Basophils Absolute 0.1 0.0 - 0.1 K/uL   Immature Granulocytes 0 %   Abs Immature Granulocytes 0.01 0.00 - 0.07 K/uL  Urinalysis, Routine w reflex microscopic -Urine, Clean Catch     Status: Abnormal   Collection Time: 11/13/22 11:40 PM  Result Value Ref Range   Color, Urine YELLOW YELLOW   APPearance CLEAR CLEAR   Specific Gravity, Urine 1.024 1.005 - 1.030   pH 6.0 5.0 - 8.0   Glucose, UA NEGATIVE NEGATIVE mg/dL   Hgb urine dipstick LARGE (A) NEGATIVE   Bilirubin Urine NEGATIVE NEGATIVE   Ketones, ur 15 (A) NEGATIVE mg/dL   Protein, ur TRACE (A) NEGATIVE mg/dL   Nitrite NEGATIVE NEGATIVE   Leukocytes,Ua SMALL (A) NEGATIVE   RBC / HPF 21-50 0 - 5 RBC/hpf   WBC, UA 11-20 0 - 5 WBC/hpf   Bacteria, UA NONE SEEN NONE SEEN   Squamous Epithelial / HPF 6-10 0 - 5 /HPF   Mucus PRESENT   Pregnancy, urine     Status: None   Collection Time: 11/13/22 11:40 PM  Result Value Ref Range   Preg Test, Ur NEGATIVE NEGATIVE   Imaging Studies: No results found.  ED COURSE and MDM  Nursing notes, initial and subsequent vitals signs, including pulse oximetry, reviewed and interpreted by myself.  Vitals:   11/13/22 2128 11/13/22 2226 11/13/22 2300  BP: (!) 128/96 (!) 131/103 (!) 128/98  Pulse: 89 85 82  Resp: 15 18 20   Temp: 98 F (36.7 C)    TempSrc: Oral    SpO2: 100% 100% 99%   Medications - No data to display    PROCEDURES  Procedures   ED DIAGNOSES  No diagnosis found.

## 2022-11-14 ENCOUNTER — Emergency Department (HOSPITAL_BASED_OUTPATIENT_CLINIC_OR_DEPARTMENT_OTHER): Payer: 59

## 2022-11-14 MED ORDER — CEFPODOXIME PROXETIL 200 MG PO TABS: 200.0000 mg | ORAL_TABLET | Freq: Two times a day (BID) | ORAL | 0 refills | Status: AC

## 2022-11-14 MED ORDER — FLUCONAZOLE 150 MG PO TABS: ORAL_TABLET | ORAL | 0 refills | Status: DC

## 2022-11-15 LAB — URINE CULTURE

## 2022-11-18 ENCOUNTER — Ambulatory Visit (INDEPENDENT_AMBULATORY_CARE_PROVIDER_SITE_OTHER): Payer: 59 | Admitting: Psychology

## 2022-11-18 DIAGNOSIS — F1010 Alcohol abuse, uncomplicated: Secondary | ICD-10-CM

## 2022-11-18 DIAGNOSIS — F4321 Adjustment disorder with depressed mood: Secondary | ICD-10-CM

## 2022-11-18 NOTE — Progress Notes (Signed)
[facility] Counselor/Therapist Progress Note  Patient ID: Regenia Lamy, MRN: [id number],    Date: 11/18/2022  Time Spent: 45  Treatment Type: Individual Therapy  Reported Symptoms: Pt presents via Caregility video and grants consent for the session.  Pt states she is in her room with no one else present.  I shared with pt that I am in my office with no one else here.  Mental Status Exam: Appearance:  Casual     Behavior: Appropriate  Motor: Normal  Speech/Language:  Clear and Coherent  Affect: Appropriate  Mood: normal  Thought process: normal  Thought content:   WNL  Sensory/Perceptual disturbances:   WNL  Orientation: oriented to person, place, and time/date  Attention: Good  Concentration: Good  Memory: WNL  Fund of knowledge:  Good  Insight:   Good  Judgment:  Good  Impulse Control: Good   Risk Assessment: Danger to Self:  No Self-injurious Behavior: No Danger to Others: No Duty to Warn:no Physical Aggression / Violence:No  Access to Firearms a concern: No  Gang Involvement:No   Subjective: Pt shares that she has just recovered from her kidney infection.  Pt shares she was not successful in her effort to decrease her use of alcohol since our last session.  Pt shares that she has talked to [facility] about her desire to stop using alcohol.  Encouraged pt to not just stop using alcohol abruptly as that could be medically dangerous for her.  Talked at length with pt about treatment programs in the area and how they would start with detoxification and introduction to the recovery process and then switch to an outpatient educational program.  Talked with pt about how to access treatment programs approved by her health insurance company.  Pt will talk with Eddie Candle this evening and move forward to a treatment program as soon as she can.  She will call the office to schedule a follow up session, either after her treatment program or before, if she has questions  or concerns.    Interventions: Cognitive Behavioral Therapy  Diagnosis:Alcohol abuse  Adjustment disorder with depressed mood  Plan: See note above for current treatment plan and follow up.  Karie Kirks, [facility]

## 2022-11-22 ENCOUNTER — Ambulatory Visit: Payer: 59 | Admitting: Family Medicine

## 2022-11-24 ENCOUNTER — Ambulatory Visit: Payer: 59 | Admitting: Family Medicine

## 2022-11-24 DIAGNOSIS — Z0289 Encounter for other administrative examinations: Secondary | ICD-10-CM

## 2023-04-25 ENCOUNTER — Encounter: Payer: 59 | Admitting: Family Medicine

## 2024-07-19 ENCOUNTER — Ambulatory Visit: Admitting: Family Medicine

## 2024-07-19 ENCOUNTER — Encounter: Payer: Self-pay | Admitting: Family Medicine

## 2024-07-19 VITALS — BP 100/70 | HR 90 | Temp 98.1°F | Ht 64.0 in | Wt 126.0 lb

## 2024-07-19 DIAGNOSIS — E519 Thiamine deficiency, unspecified: Secondary | ICD-10-CM | POA: Diagnosis not present

## 2024-07-19 DIAGNOSIS — F321 Major depressive disorder, single episode, moderate: Secondary | ICD-10-CM | POA: Diagnosis not present

## 2024-07-19 DIAGNOSIS — F1020 Alcohol dependence, uncomplicated: Secondary | ICD-10-CM | POA: Diagnosis not present

## 2024-07-19 DIAGNOSIS — E538 Deficiency of other specified B group vitamins: Secondary | ICD-10-CM | POA: Diagnosis not present

## 2024-07-19 DIAGNOSIS — F411 Generalized anxiety disorder: Secondary | ICD-10-CM | POA: Diagnosis not present

## 2024-07-19 LAB — HEMOGLOBIN A1C: Hgb A1c MFr Bld: 5.3 % (ref 4.6–6.5)

## 2024-07-19 LAB — COMPREHENSIVE METABOLIC PANEL WITH GFR
ALT: 43 U/L — ABNORMAL HIGH (ref 3–35)
AST: 104 U/L — ABNORMAL HIGH (ref 5–37)
Albumin: 4 g/dL (ref 3.5–5.2)
Alkaline Phosphatase: 107 U/L (ref 39–117)
BUN: 5 mg/dL — ABNORMAL LOW (ref 6–23)
CO2: 30 meq/L (ref 19–32)
Calcium: 8.9 mg/dL (ref 8.4–10.5)
Chloride: 97 meq/L (ref 96–112)
Creatinine, Ser: 0.65 mg/dL (ref 0.40–1.20)
GFR: 112.78 mL/min
Glucose, Bld: 80 mg/dL (ref 70–99)
Potassium: 4.5 meq/L (ref 3.5–5.1)
Sodium: 136 meq/L (ref 135–145)
Total Bilirubin: 0.8 mg/dL (ref 0.2–1.2)
Total Protein: 7.5 g/dL (ref 6.0–8.3)

## 2024-07-19 LAB — CBC WITH DIFFERENTIAL/PLATELET
Basophils Absolute: 0.1 K/uL (ref 0.0–0.1)
Basophils Relative: 1.8 % (ref 0.0–3.0)
Eosinophils Absolute: 0.1 K/uL (ref 0.0–0.7)
Eosinophils Relative: 1.1 % (ref 0.0–5.0)
HCT: 43.1 % (ref 36.0–46.0)
Hemoglobin: 14.7 g/dL (ref 12.0–15.0)
Lymphocytes Relative: 44.1 % (ref 12.0–46.0)
Lymphs Abs: 2.3 K/uL (ref 0.7–4.0)
MCHC: 34 g/dL (ref 30.0–36.0)
MCV: 107.1 fl — ABNORMAL HIGH (ref 78.0–100.0)
Monocytes Absolute: 0.5 K/uL (ref 0.1–1.0)
Monocytes Relative: 9.8 % (ref 3.0–12.0)
Neutro Abs: 2.3 K/uL (ref 1.4–7.7)
Neutrophils Relative %: 43.2 % (ref 43.0–77.0)
Platelets: 428 K/uL — ABNORMAL HIGH (ref 150.0–400.0)
RBC: 4.03 Mil/uL (ref 3.87–5.11)
RDW: 14.8 % (ref 11.5–15.5)
WBC: 5.3 K/uL (ref 4.0–10.5)

## 2024-07-19 LAB — TSH: TSH: 2.42 u[IU]/mL (ref 0.35–5.50)

## 2024-07-19 LAB — VITAMIN B12: Vitamin B-12: 428 pg/mL (ref 211–911)

## 2024-07-19 LAB — FOLATE: Folate: 3.5 ng/mL — ABNORMAL LOW

## 2024-07-19 MED ORDER — CITALOPRAM HYDROBROMIDE 20 MG PO TABS: 20.0000 mg | ORAL_TABLET | Freq: Every day | ORAL | 3 refills | Status: AC

## 2024-07-19 MED ORDER — NALTREXONE HCL 50 MG PO TABS: 50.0000 mg | ORAL_TABLET | Freq: Every day | ORAL | 3 refills | Status: AC

## 2024-07-19 MED ORDER — DIAZEPAM 2 MG PO TABS: ORAL_TABLET | ORAL | 0 refills | Status: AC

## 2024-07-19 MED ORDER — TRAZODONE HCL 50 MG PO TABS: 25.0000 mg | ORAL_TABLET | Freq: Every evening | ORAL | 3 refills | Status: AC | PRN

## 2024-07-19 NOTE — Progress Notes (Signed)
 "  Established Patient Office Visit  Subjective   Patient ID: Emma Mcdonald, female    DOB: 05-Apr-1986  Age: 39 y.o. MRN: [id number]  Chief Complaint  Patient presents with   Anorexia    Lack of appetite x1 week, initially started after having suspected URI   Alcohol Problem    Patient states she is drinking wine-one bottle per day x1.5 years    Alcohol Problem   Discussed the use of AI scribe software for clinical note transcription with the patient, who gave verbal consent to proceed.  History of Present Illness   Emma Mcdonald is a 39 year old female with alcohol use disorder and depression who presents with lack of appetite and alcohol withdrawal symptoms.  She developed persistent lack of appetite shortly after Christmas, initially with a fever that has resolved. She now has nausea when she tries to eat and has been taking mostly water and some juice, with inconsistent food intake over the past few days.  She drinks about a bottle of wine daily and wants to stop because she feels unwell. When she stops drinking she has severe tremors, drenching sweats, anxiety, restlessness, difficulty sleeping, nightmares, and a racing heart. She denies vomiting or hallucinations during withdrawal.  Her depression has worsened with low motivation and self-criticism. Citalopram  previously helped but she has not taken it recently after moving. She has avoided hydroxyzine  due to concern about side effects and recalls being changed to trazodone  in the past. Her move has disrupted her routine, and she has not taken vitamins for about a month.       Current Outpatient Medications  Medication Instructions   citalopram  (CELEXA ) 20 mg, Oral, Daily   cyanocobalamin  (VITAMIN B12) 1,000 mcg, Oral, Daily   diazepam  (VALIUM ) 2 MG tablet Take 5 tablets (10 mg total) by mouth in the morning and at bedtime for 1 day, THEN 4 tablets (8 mg total) in the morning and at bedtime for 1 day, THEN 3  tablets (6 mg total) in the morning and at bedtime for 1 day, THEN 2 tablets (4 mg total) 2 (two) times daily for 1 day, THEN 1 tablet (2 mg total) 2 (two) times daily for 1 day.   folic acid  (FOLVITE ) 1 mg, Oral, Daily   naltrexone  (DEPADE) 50 mg, Oral, Daily   thiamine  (VITAMIN B1) 100 mg, Oral, Daily   traZODone  (DESYREL ) 25-50 mg, Oral, At bedtime PRN    Patient Active Problem List   Diagnosis Date Noted   Tremor 03/29/2022   Alcohol causing toxic effect 03/29/2022   Anxiety disorder 03/29/2022   Pruritus of external auditory canal 03/29/2022   Family history of breast cancer in mother 08/02/2018     Review of Systems  All other systems reviewed and are negative.     Objective:     BP 100/70   Pulse 90   Temp 98.1 F (36.7 C) (Oral)   Ht 5' 4 (1.626 m)   Wt 126 lb (57.2 kg)   LMP 07/06/2024 (Exact Date)   SpO2 96%   BMI 21.63 kg/m    Physical Exam Vitals reviewed.  Constitutional:      Appearance: Normal appearance. She is well-groomed and normal weight.  Eyes:     Conjunctiva/sclera: Conjunctivae normal.  Neck:     Thyroid: No thyromegaly.  Cardiovascular:     Rate and Rhythm: Normal rate and regular rhythm.     Pulses: Normal pulses.     Heart sounds: S1  normal and S2 normal.  Pulmonary:     Effort: Pulmonary effort is normal.     Breath sounds: Normal breath sounds and air entry.  Abdominal:     General: Bowel sounds are normal.  Musculoskeletal:     Right lower leg: No edema.     Left lower leg: No edema.  Neurological:     Mental Status: She is alert and oriented to person, place, and time. Mental status is at baseline.     Gait: Gait is intact.  Psychiatric:        Mood and Affect: Mood and affect normal.        Speech: Speech normal.        Behavior: Behavior normal.        Judgment: Judgment normal.      No results found for any visits on 07/19/24.    The ASCVD Risk score (Arnett DK, et al., 2019) failed to calculate for the  following reasons:   The 2019 ASCVD risk score is only valid for ages 78 to 24   * - Cholesterol units were assumed    Assessment & Plan:  Generalized anxiety disorder -     CBC with Differential/Platelet; Future -     Comprehensive metabolic panel with GFR; Future -     TSH; Future -     Hemoglobin A1c; Future -     Citalopram  Hydrobromide; Take 1 tablet (20 mg total) by mouth daily.  Dispense: 30 tablet; Refill: 3  B12 deficiency -     Vitamin B12; Future  Folate deficiency -     Folate; Future  Thiamine  deficiency -     Vitamin B1; Future  Alcohol use disorder, moderate, dependence (HCC) -     Hemoglobin A1c; Future -     diazePAM ; Take 5 tablets (10 mg total) by mouth in the morning and at bedtime for 1 day, THEN 4 tablets (8 mg total) in the morning and at bedtime for 1 day, THEN 3 tablets (6 mg total) in the morning and at bedtime for 1 day, THEN 2 tablets (4 mg total) 2 (two) times daily for 1 day, THEN 1 tablet (2 mg total) 2 (two) times daily for 1 day.  Dispense: 30 tablet; Refill: 0 -     Naltrexone  HCl; Take 1 tablet (50 mg total) by mouth daily.  Dispense: 30 tablet; Refill: 3  Current moderate episode of major depressive disorder without prior episode (HCC) -     Citalopram  Hydrobromide; Take 1 tablet (20 mg total) by mouth daily.  Dispense: 30 tablet; Refill: 3 -     traZODone  HCl; Take 0.5-1 tablets (25-50 mg total) by mouth at bedtime as needed for sleep.  Dispense: 30 tablet; Refill: 3   Assessment and Plan    Alcohol use disorder with withdrawal Chronic alcohol use disorder with recent exacerbation, consuming a large bottle of wine daily. Withdrawal symptoms include tremors, night sweats, agitation, and anxiety. CIWA score of 22 indicates moderate to severe withdrawal symptoms. No seizure activity reported. Discussed outpatient versus inpatient detox options. She prefers outpatient detox with close follow-up. - Initiated diazepam  taper starting at 10 mg daily,  decreasing by 2 mg every 24 hours over 5 days. - Advised starting diazepam  at night to assess drowsiness. - Instructed to contact provider if withdrawal symptoms persist or worsen. - Referred to [facility] for urgent care if withdrawal symptoms become severe. - Will initiate naltrexone  50 mg daily post-diazepam   taper to manage cravings.  Major depressive disorder, single episode, moderate Moderate depressive symptoms with a score of 13 on depression screen. Symptoms include lack of motivation and dissatisfaction with self. Previous treatment with citalopram  was effective. - Restarted citalopram  20 mg daily.  Generalized anxiety disorder Anxiety symptoms with a score of 6 on anxiety screen. Symptoms include restlessness and fidgetiness, particularly during withdrawal. Previous treatment with hydroxyzine  was not initiated due to concerns about side effects. Trazodone  was previously used for sleep. - Restarted trazodone  for sleep management.  Vitamin B deficiencies (B12, folate, thiamine ) Potential vitamin B deficiencies due to lack of supplementation during recent move. Labs needed to assess current vitamin levels. - Ordered blood work to assess vitamin B12, folate, and thiamine  levels.        Return in about 2 weeks (around 08/02/2024) for follow up.    Heron CHRISTELLA Sharper, MD "

## 2024-07-20 ENCOUNTER — Ambulatory Visit: Payer: Self-pay | Admitting: Family Medicine

## 2024-07-20 DIAGNOSIS — F1020 Alcohol dependence, uncomplicated: Secondary | ICD-10-CM

## 2024-07-20 MED ORDER — DIAZEPAM 10 MG PO TABS: 10.0000 mg | ORAL_TABLET | Freq: Four times a day (QID) | ORAL | 0 refills | Status: DC | PRN

## 2024-07-23 LAB — VITAMIN B1: Vitamin B1 (Thiamine): <6 nmol/L — ABNORMAL LOW (ref 8–30)

## 2024-08-03 ENCOUNTER — Encounter: Payer: Self-pay | Admitting: Family Medicine

## 2024-08-03 ENCOUNTER — Ambulatory Visit: Admitting: Family Medicine

## 2024-08-03 DIAGNOSIS — E538 Deficiency of other specified B group vitamins: Secondary | ICD-10-CM

## 2024-08-03 DIAGNOSIS — E519 Thiamine deficiency, unspecified: Secondary | ICD-10-CM

## 2024-08-03 MED ORDER — VITAMIN B-12 1000 MCG PO TABS: 1000.0000 ug | ORAL_TABLET | Freq: Every day | ORAL | 3 refills | Status: AC

## 2024-08-03 MED ORDER — THIAMINE HCL 100 MG PO TABS: 100.0000 mg | ORAL_TABLET | Freq: Every day | ORAL | 5 refills | Status: AC

## 2024-08-03 MED ORDER — FOLIC ACID 1 MG PO TABS: 1.0000 mg | ORAL_TABLET | Freq: Every day | ORAL | 3 refills | Status: AC

## 2024-08-03 NOTE — Progress Notes (Signed)
 "  Established Patient Office Visit  Subjective   Patient ID: Emma Mcdonald, female    DOB: Aug 04, 1985  Age: 39 y.o. MRN: [id number]  Chief Complaint  Patient presents with   Medical Management of Chronic Issues    HPI Discussed the use of AI scribe software for clinical note transcription with the patient, who gave verbal consent to proceed.  History of Present Illness   Emma Mcdonald is a 39 year old female who presents for a follow-up visit after discontinuing alcohol use.  She has been alcohol-free for sixteen days and feels well without cravings. She stopped Valium  without withdrawal symptoms such as shakiness, nervousness, nausea, or rapid heart rate.  She can fall asleep but wakes frequently at night and sleeps better during the day after her children leave for school. She has not started the prescribed trazodone  for sleep.  She developed headaches over the past two days after reaching two weeks of sobriety. One headache was severe enough to prevent sleep but improved with ibuprofen .  Her appetite has returned, and her weight increased from 126 to 132 pounds while eating two to three meals daily.  She is not taking folic acid , B12, or B1 because they are packed for an upcoming move, and the move has disrupted her routine and access to these vitamins.       Current Outpatient Medications  Medication Instructions   citalopram  (CELEXA ) 20 mg, Oral, Daily   cyanocobalamin  (VITAMIN B12) 1,000 mcg, Oral, Daily   folic acid  (FOLVITE ) 1 mg, Oral, Daily   naltrexone  (DEPADE) 50 mg, Oral, Daily   thiamine  (VITAMIN B1) 100 mg, Oral, Daily   traZODone  (DESYREL ) 25-50 mg, Oral, At bedtime PRN    Patient Active Problem List   Diagnosis Date Noted   Tremor 03/29/2022   Alcohol causing toxic effect 03/29/2022   Anxiety disorder 03/29/2022   Pruritus of external auditory canal 03/29/2022   Family history of breast cancer in mother 08/02/2018     Review of  Systems  All other systems reviewed and are negative.     Objective:     BP 100/62   Pulse 67   Temp 98.1 F (36.7 C) (Oral)   Ht 5' 4 (1.626 m)   Wt 132 lb 8 oz (60.1 kg)   LMP 07/06/2024 (Exact Date)   SpO2 98%   BMI 22.74 kg/m    Physical Exam Vitals reviewed.  Constitutional:      Appearance: Normal appearance. She is normal weight.  Pulmonary:     Effort: Pulmonary effort is normal.  Musculoskeletal:     Right lower leg: No edema.     Left lower leg: No edema.  Neurological:     Mental Status: She is alert and oriented to person, place, and time. Mental status is at baseline.  Psychiatric:        Mood and Affect: Mood normal.        Behavior: Behavior normal.      No results found for any visits on 08/03/24.    The ASCVD Risk score (Arnett DK, et al., 2019) failed to calculate for the following reasons:   The 2019 ASCVD risk score is only valid for ages 79 to 69   * - Cholesterol units were assumed    Assessment & Plan:  Folate deficiency -     Folic Acid ; Take 1 tablet (1 mg total) by mouth daily.  Dispense: 90 tablet; Refill: 3  B12 deficiency -  Vitamin B-12; Take 1 tablet (1,000 mcg total) by mouth daily.  Dispense: 90 tablet; Refill: 3  Thiamine  deficiency -     Thiamine  HCl; Take 1 tablet (100 mg total) by mouth daily.  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 Partner provides strong support. Discussed potential for headaches due to blood pressure changes and vitamin deficiencies. - Continue naltrexone  50 mg tablets as needed. - Encouraged participation in support meetings, either in-person or online. - Scheduled follow-up appointment for March 9th to reassess condition and labs.  Vitamin B deficiencies (folate, B12, thiamine ) Vitamin B deficiencies with low levels of folic acid , B12, and B1.  Importance of vitamins for energy levels and overall health discussed. - Prescribed new prescriptions for folic acid , B12, and B1. - Advised to pick up prescriptions from the pharmacy.  Insomnia Difficulty staying asleep, possibly due to disrupted sleep schedule. Trazodone  prescribed to help maintain sleep. Discussed potential benefits of trazodone  in resetting sleep schedule. - Start trazodone  50 mg temporarily for a couple of weeks to help maintain sleep. - Advised to avoid morning naps to help reset sleep schedule.  Headache Recent onset of headaches, possibly related to blood pressure changes or vitamin deficiencies. Blood pressure currently low at 100/62. Ibuprofen  recommended for headache relief. - Advised taking up to 800 mg of ibuprofen  with food for headache relief. - Monitor headache frequency and severity; if headaches persist for a month, will consider reevaluation.  Elevated liver enzymes Elevated AST levels likely due to previous alcohol use. Improvement expected with continued abstinence from alcohol. - Will repeat liver function tests in a couple of months to monitor improvement.        Return in about 6 weeks (around 09/17/2024) for follow up .    Heron CHRISTELLA Sharper, MD "

## 2024-08-03 NOTE — Patient Instructions (Signed)
 Ibuprofen  -- up to 800 mg every 8 hours  -- take with food.

## 2024-09-17 ENCOUNTER — Ambulatory Visit: Admitting: Family Medicine
